# Patient Record
Sex: Female | Born: 1940
Health system: Southern US, Community
[De-identification: ages and names within clinical notes are randomized; demographics above are authoritative.]

## PROBLEM LIST (undated history)

## (undated) ENCOUNTER — Emergency Department: Admission: EM | Payer: PPO

## (undated) DIAGNOSIS — E785 Hyperlipidemia, unspecified: Secondary | ICD-10-CM

## (undated) DIAGNOSIS — I2510 Atherosclerotic heart disease of native coronary artery without angina pectoris: Secondary | ICD-10-CM

## (undated) DIAGNOSIS — E079 Disorder of thyroid, unspecified: Secondary | ICD-10-CM

## (undated) DIAGNOSIS — I5181 Takotsubo syndrome: Secondary | ICD-10-CM

## (undated) DIAGNOSIS — I5020 Unspecified systolic (congestive) heart failure: Secondary | ICD-10-CM

## (undated) HISTORY — DX: Hyperlipidemia, unspecified: E78.5

## (undated) HISTORY — DX: Unspecified systolic (congestive) heart failure: I50.20

## (undated) HISTORY — DX: Atherosclerotic heart disease of native coronary artery without angina pectoris: I25.10

## (undated) HISTORY — DX: Takotsubo syndrome: I51.81

## (undated) HISTORY — PX: INGUINAL HERNIA REPAIR: SUR1180

## (undated) HISTORY — DX: Disorder of thyroid, unspecified: E07.9

---

## 2007-06-06 ENCOUNTER — Ambulatory Visit: Payer: Self-pay | Admitting: Family Medicine

## 2011-08-29 HISTORY — PX: COLONOSCOPY: SHX174

## 2012-02-19 DIAGNOSIS — E039 Hypothyroidism, unspecified: Secondary | ICD-10-CM | POA: Insufficient documentation

## 2014-02-10 DIAGNOSIS — R42 Dizziness and giddiness: Secondary | ICD-10-CM | POA: Insufficient documentation

## 2015-03-04 DIAGNOSIS — E785 Hyperlipidemia, unspecified: Secondary | ICD-10-CM | POA: Insufficient documentation

## 2015-12-13 ENCOUNTER — Ambulatory Visit: Payer: Self-pay | Admitting: Family Medicine

## 2016-03-01 ENCOUNTER — Ambulatory Visit: Payer: PPO | Admitting: Family Medicine

## 2016-03-09 ENCOUNTER — Encounter: Payer: Self-pay | Admitting: Family Medicine

## 2016-03-09 ENCOUNTER — Ambulatory Visit (INDEPENDENT_AMBULATORY_CARE_PROVIDER_SITE_OTHER): Payer: PPO | Admitting: Family Medicine

## 2016-03-09 ENCOUNTER — Ambulatory Visit: Payer: PPO | Admitting: Family Medicine

## 2016-03-09 VITALS — BP 120/80 | HR 64 | Resp 12 | Ht 62.0 in | Wt 113.0 lb

## 2016-03-09 DIAGNOSIS — Z7189 Other specified counseling: Secondary | ICD-10-CM

## 2016-03-09 DIAGNOSIS — E032 Hypothyroidism due to medicaments and other exogenous substances: Secondary | ICD-10-CM | POA: Diagnosis not present

## 2016-03-09 DIAGNOSIS — E785 Hyperlipidemia, unspecified: Secondary | ICD-10-CM

## 2016-03-09 DIAGNOSIS — Z7689 Persons encountering health services in other specified circumstances: Secondary | ICD-10-CM

## 2016-03-09 MED ORDER — SYNTHROID 50 MCG PO TABS: 50.0000 ug | ORAL_TABLET | Freq: Every day | ORAL | Status: DC

## 2016-03-09 MED ORDER — SYNTHROID 25 MCG PO TABS: 12.5000 ug | ORAL_TABLET | Freq: Every day | ORAL | Status: DC

## 2016-03-09 NOTE — Patient Instructions (Signed)
GUIDELINES FOR  LOW-CHOLESTEROL, LOW-TRIGLYCERIDE DIETS    FOODS TO USE   MEATS, FISH Choose lean meats (chicken, turkey, veal, and non-fatty cuts of beef with excess fat trimmed; one serving = 3 oz of cooked meat). Also, fresh or frozen fish, canned fish packed in water, and shellfish (lobster, crabs, shrimp, and oysters). Limit use to no more than one serving of one of these per week. Shellfish are high in cholesterol but low in saturated fat and should be used sparingly. Meats and fish should be broiled (pan or oven) or baked on a rack.  EGGS Egg substitutes and egg whites (use freely). Egg yolks (limit two per week).  FRUITS Eat three servings of fresh fruit per day (1 serving =  cup). Be sure to have at least one citrus fruit daily. Frozen and canned fruit with no sugar or syrup added may be used.  VEGETABLES Most vegetables are not limited (see next page). One dark-green (string beans, escarole) or one deep yellow (squash) vegetable is recommended daily. Cauliflower, broccoli, and celery, as well as potato skins, are recommended for their fiber content. (Fiber is associated with cholesterol reduction) It is preferable to steam vegetables, but they may be boiled, strained, or braised with polyunsaturated vegetable oil (see below).  BEANS Dried peas or beans (1 serving =  cup) may be used as a bread substitute.  NUTS Almonds, walnuts, and peanuts may be used sparingly  (1 serving = 1 Tablespoonful). Use pumpkin, sesame, or sunflower seeds.  BREADS, GRAINS One roll or one slice of whole grain or enriched bread may be used, or three soda crackers or four pieces of melba toast as a substitute. Spaghetti, rice or noodles ( cup) or  large ear of corn may be used as a bread substitute. In preparing these foods do not use butter or shortening, use soft margarine. Also use egg and sugar substitutes.  Choose high fiber grains, such as oats and whole wheat.  CEREALS Use  cup of hot cereal or  cup of  cold cereal per day. Add a sugar substitute if desired, with 99% fat free or skim milk.  MILK PRODUCTS Always use 99% fat free or skim milk, dairy products such as low fat cheeses (farmer's uncreamed diet cottage), low-fat yogurt, and powdered skim milk.  FATS, OILS Use soft (not stick) margarine; vegetable oils that are high in polyunsaturated fats (such as safflower, sunflower, soybean, corn, and cottonseed). Always refrigerate meat drippings to harden the fat and remove it before preparing gravies  DESSERTS, SNACKS Limit to two servings per day; substitute each serving for a bread/cereal serving: ice milk, water sherbet (1/4 cup); unflavored gelatin or gelatin flavored with sugar substitute (1/3 cup); pudding prepared with skim milk (1/2 cup); egg white souffls; unbuttered popcorn (1  cups). Substitute carob for chocolate.  BEVERAGES Fresh fruit juices (limit 4 oz per day); black coffee, plain or herbal teas; soft drinks with sugar substitutes; club soda, preferably salt-free; cocoa made with skim milk or nonfat dried milk and water (sugar substitute added if desired); clear broth. Alcohol: limit two servings per day (see second page).  MISCELLANEOUS  You may use the following freely: vinegar, spices, herbs, nonfat bouillon, mustard, Worcestershire sauce, soy sauce, flavoring essence.                  GUIDELINES FOR  LOW-CHOLESTEROL, LOW TRIGLYCERIDE DIETS    FOODS TO AVOID   MEATS, FISH Marbled beef, pork, bacon, sausage, and other pork products; fatty   fowl (duck, goose); skin and fat of turkey and chicken; processed meats; luncheon meats (salami, bologna); frankfurters and fast-food hamburgers (theyre loaded with fat); organ meats (kidneys, liver); canned fish packed in oil.  EGGS Limit egg yolks to two per week.   FRUITS Coconuts (rich in saturated fats).  VEGETABLES Avoid avocados. Starchy vegetables (potatoes, corn, lima beans, dried peas, beans) may be used only if  substitutes for a serving of bread or cereal. (Baked potato skin, however, is desirable for its fiber content.  BEANS Commercial baked beans with sugar and/or pork added.  NUTS Avoid nuts.  Limit peanuts and walnuts to one tablespoonful per day.  BREADS, GRAINS Any baked goods with shortening and/or sugar. Commercial mixes with dried eggs and whole milk. Avoid sweet rolls, doughnuts, breakfast pastries (Danish), and sweetened packaged cereals (the added sugar converts readily to triglycerides).  MILK PRODUCTS Whole milk and whole-milk packaged goods; cream; ice cream; whole-milk puddings, yogurt, or cheeses; nondairy cream substitutes.  FATS, OILS Butter, lard, animal fats, bacon drippings, gravies, cream sauces as well as palm and coconut oils. All these are high in saturated fats. Examine labels on cholesterol free products for hydrogenated fats. (These are oils that have been hardened into solids and in the process have become saturated.)  DESSERTS, SNACKS Fried snack foods like potato chips; chocolate; candies in general; jams, jellies, syrups; whole- milk puddings; ice cream and milk sherbets; hydrogenated peanut butter.  BEVERAGES Sugared fruit juices and soft drinks; cocoa made with whole milk and/or sugar. When using alcohol (1 oz liquor, 5 oz beer, or 2  oz dry table wine per serving), one serving must be substituted for one bread or cereal serving (limit, two servings of alcohol per day).   SPECIAL NOTES    1. Remember that even non-limited foods should be used in moderation. 2. While on a cholesterol-lowering diet, be sure to avoid animal fats and marbled meats. 3. 3. While on a triglyceride-lowering diet, be sure to avoid sweets and to control the amount of carbohydrates you eat (starchy foods such as flour, bread, potatoes).While on a tri-glyceride-lowering diet, be sure to avoid sweets 4. Buy a good low-fat cookbook, such as the one published by the American Heart Association. 5. Consult  your physician if you have any questions.               [facility] Low Glycemic Diet Plan   Low Glycemic Foods (20-49) Moderate Glycemic Foods (50-69) High Glycemic Foods (70-100)      Breakfast Creals Breakfast Cereals Breakfast Cereals  All Bran All-Bran Fruit'n Oats   Bran Buds Bran Chex   Cheerios Corn chex    Fiber One Oatmeal (not instant)   Just Right Mini-Wheats   Corn Flakes Cream of Wheat    Oat Bran Special K Swiss Muesli   Grape Nuts Grape Nut Flakes      Grits Nutri-Grain    Fruits and fruit juice: Fruits Puffed Rice Puffed Wheat    (Limit to 1-2 Servings per day) Banana (under-ride) Dates   Rice Chex Rice Krispies    Apples Apricots (fresh/dried)   Figs Grapes   Shredded Wheat Team    Blackberries Blueberries   Kiwi Mango   Total     Cherries Cranberries   Oranges Raisins     Peaches Pears    Fruits  Plums Prunes   Fruit Juices Pineapple Watermelon    Grapefruit Raspberries   Cranberry Juice Orange Juice   Banana (over-ripe)       Strawberries Tangerines      Apple Juice Grapefruit Juice   Beans and Legumes Beverages  Tomato Juice    [place]-type baked beans Sodas, sweet tea, pineapple juice   Canned pinto, kidney, or navy beans   Beans and Legumes (fresh-cooked) Green peas Vegetables  Black-eyed peas Butter Beans    Potato, baked, boiled, fried, mashed  Chick peas Lentils   Vegetables [place] fries  Green beans Lima beans   Beets Carrots   Canned or frozen corn  Kidney beans Navy beans   Sweet potato Yam   Parsnips  Pinto beans Snow peas   Corn on the cob Winter squash      Non-starchy vegetables Grains Breads  Asparagus, avocado, broccoli, cabbage Cornmeal Rice, brown   Most breads (white and whole grain)  cauliflower, celery, cucumber, greens Rice, white Couscous   Bagels Bread sticks    lettuce, mushrooms, peppers, tomatoes  Bread stuffing Kaiser roll    okra, onions, spinach, summer squash Pasta Dinner rolls    [employer], cheese     Grains Ravioli, meat filled Spaghetti, white   Grains  Barley Bulgur    Rice, instant Tapioca, with milk    Rye Wild rice   Nuts    Cashews Macadamia   Candy and most cookies  Nuts and oils    Almonds, peanuts, sunflower seeds Snacks Snacks  hazelnuts, pecans, walnuts Chocolate Ice cream, lowfat   Donuts Corn chips    Oils that are liquid at room temperature Muffin Popcorn   Jelly beans Pretzels      Pastries  Dairy, fish, meat, soy, and eggs    Milk, skim Lowfat cheese    Restaurant and ethnic foods  Yogurt, lowfat, fruit sugar sweetened  Most [place] food (sugar in stir fry    or wok sauce)  Lean red meat Fish    Teriyaki-style meats and vegetables  Skinless chicken and [place], shellfish        Egg whites (up to 3 daily), Soy Products    Egg yolks (up to 7 or _____ per week)       Why follow it? Research shows. . Those who follow the Mediterranean diet have a reduced risk of heart disease  . The diet is associated with a reduced incidence of Parkinson's and Alzheimer's diseases . People following the diet may have longer life expectancies and lower rates of chronic diseases  . The Dietary Guidelines for Americans recommends the Mediterranean diet as an eating plan to promote health and prevent disease  What Is the Mediterranean Diet?  . Healthy eating plan based on typical foods and recipes of Mediterranean-style cooking . The diet is primarily a plant based diet; these foods should make up a majority of meals   Starches - Plant based foods should make up a majority of meals - They are an important sources of vitamins, minerals, energy, antioxidants, and fiber - Choose whole grains, foods high in fiber and minimally processed items  - Typical grain sources include wheat, oats, barley, corn, brown rice, bulgar, farro, millet, polenta, couscous  - Various types of beans include chickpeas, lentils, fava beans, black beans, white beans    Fruits  Veggies - Large quantities of antioxidant rich fruits & veggies; 6 or more servings  - Vegetables can be eaten raw or lightly drizzled with oil and cooked  - Vegetables common to the traditional Mediterranean Diet include: artichokes, arugula, beets, broccoli, brussel sprouts, cabbage, carrots, celery, collard greens, cucumbers, eggplant, kale,  leeks, lemons, lettuce, mushrooms, okra, onions, peas, peppers, potatoes, pumpkin, radishes, rutabaga, shallots, spinach, sweet potatoes, turnips, zucchini - Fruits common to the Mediterranean Diet include: apples, apricots, avocados, cherries, clementines, dates, figs, grapefruits, grapes, melons, nectarines, oranges, peaches, pears, pomegranates, strawberries, tangerines  Fats - Replace butter and margarine with healthy oils, such as olive oil, canola oil, and tahini  - Limit nuts to no more than a handful a day  - Nuts include walnuts, almonds, pecans, pistachios, pine nuts  - Limit or avoid candied, honey roasted or heavily salted nuts - Olives are central to the [employer] - can be eaten whole or used in a variety of dishes   Meats Protein - Limiting red meat: no more than a few times a month - When eating red meat: choose lean cuts and keep the portion to the size of deck of cards - Eggs: approx. 0 to 4 times a week  - Fish and lean poultry: at least 2 a week  - Healthy protein sources include, chicken, [place], lean beef, lamb - Increase intake of seafood such as tuna, salmon, trout, mackerel, shrimp, scallops - Avoid or limit high fat processed meats such as sausage and bacon  Dairy - Include moderate amounts of low fat dairy products  - Focus on healthy dairy such as fat free yogurt, skim milk, low or reduced fat cheese - Limit dairy products higher in fat such as whole or 2% milk, cheese, ice cream  Alcohol - Moderate amounts of red wine is ok  - No more than 5 oz daily for women (all ages) and men older than age 54  - No more  than 10 oz of wine daily for men younger than 7  Other - Limit sweets and other desserts  - Use herbs and spices instead of salt to flavor foods  - Herbs and spices common to the traditional Mediterranean Diet include: basil, bay leaves, chives, cloves, cumin, fennel, garlic, lavender, marjoram, mint, oregano, parsley, pepper, rosemary, sage, savory, sumac, tarragon, thyme   It's not just a diet, it's a lifestyle:  . The Mediterranean diet includes lifestyle factors typical of those in the region  . Foods, drinks and meals are best eaten with others and savored . Daily physical activity is important for overall good health . This could be strenuous exercise like running and aerobics . This could also be more leisurely activities such as walking, housework, yard-work, or taking the stairs . Moderation is the key; a balanced and healthy diet accommodates most foods and drinks . Consider portion sizes and frequency of consumption of certain foods   Meal Ideas & Options:  . Breakfast:  o Whole wheat toast or whole wheat English muffins with peanut butter & hard boiled egg o Steel cut oats topped with apples & cinnamon and skim milk  o Fresh fruit: banana, strawberries, melon, berries, peaches  o Smoothies: strawberries, bananas, greek yogurt, peanut butter o Low fat greek yogurt with blueberries and granola  o Egg white omelet with spinach and mushrooms o Breakfast couscous: whole wheat couscous, apricots, skim milk, cranberries  . Sandwiches:  o Hummus and grilled vegetables (peppers, zucchini, squash) on whole wheat bread   o Grilled chicken on whole wheat pita with lettuce, tomatoes, cucumbers or tzatziki  o Tuna salad on whole wheat bread: tuna salad made with greek yogurt, olives, red peppers, capers, green onions o Garlic rosemary lamb pita: lamb sauted with garlic, rosemary, salt & pepper; add lettuce, cucumber, greek  yogurt to pita - flavor with lemon juice and black pepper   . Seafood:  o Mediterranean grilled salmon, seasoned with garlic, basil, parsley, lemon juice and black pepper o Shrimp, lemon, and spinach whole-grain pasta salad made with low fat greek yogurt  o Seared scallops with lemon orzo  o Seared tuna steaks seasoned salt, pepper, coriander topped with tomato mixture of olives, tomatoes, olive oil, minced garlic, parsley, green onions and cappers  . Meats:  o Herbed greek chicken salad with kalamata olives, cucumber, feta  o Red bell peppers stuffed with spinach, bulgur, lean ground beef (or lentils) & topped with feta   o Kebabs: skewers of chicken, tomatoes, onions, zucchini, squash  o [place] burgers: made with red onions, mint, dill, lemon juice, feta cheese topped with roasted red peppers . Vegetarian o Cucumber salad: cucumbers, artichoke hearts, celery, red onion, feta cheese, tossed in olive oil & lemon juice  o Hummus and whole grain pita points with a greek salad (lettuce, tomato, feta, olives, cucumbers, red onion) o Lentil soup with celery, carrots made with vegetable broth, garlic, salt and pepper  o Tabouli salad: parsley, bulgur, mint, scallions, cucumbers, tomato, radishes, lemon juice, olive oil, salt and pepper.        Why follow it? Research shows. . Those who follow the Mediterranean diet have a reduced risk of heart disease  . The diet is associated with a reduced incidence of Parkinson's and Alzheimer's diseases . People following the diet may have longer life expectancies and lower rates of chronic diseases  . The Dietary Guidelines for Americans recommends the Mediterranean diet as an eating plan to promote health and prevent disease  What Is the Mediterranean Diet?  . Healthy eating plan based on typical foods and recipes of Mediterranean-style cooking . The diet is primarily a plant based diet; these foods should make up a majority of meals   Starches - Plant based foods should make up a majority of meals - They are an  important sources of vitamins, minerals, energy, antioxidants, and fiber - Choose whole grains, foods high in fiber and minimally processed items  - Typical grain sources include wheat, oats, barley, corn, brown rice, bulgar, farro, millet, polenta, couscous  - Various types of beans include chickpeas, lentils, fava beans, black beans, white beans   Fruits  Veggies - Large quantities of antioxidant rich fruits & veggies; 6 or more servings  - Vegetables can be eaten raw or lightly drizzled with oil and cooked  - Vegetables common to the traditional Mediterranean Diet include: artichokes, arugula, beets, broccoli, brussel sprouts, cabbage, carrots, celery, collard greens, cucumbers, eggplant, kale, leeks, lemons, lettuce, mushrooms, okra, onions, peas, peppers, potatoes, pumpkin, radishes, rutabaga, shallots, spinach, sweet potatoes, turnips, zucchini - Fruits common to the Mediterranean Diet include: apples, apricots, avocados, cherries, clementines, dates, figs, grapefruits, grapes, melons, nectarines, oranges, peaches, pears, pomegranates, strawberries, tangerines  Fats - Replace butter and margarine with healthy oils, such as olive oil, canola oil, and tahini  - Limit nuts to no more than a handful a day  - Nuts include walnuts, almonds, pecans, pistachios, pine nuts  - Limit or avoid candied, honey roasted or heavily salted nuts - Olives are central to the [employer] - can be eaten whole or used in a variety of dishes   Meats Protein - Limiting red meat: no more than a few times a month - When eating red meat: choose lean cuts and keep the portion to the size of  deck of cards - Eggs: approx. 0 to 4 times a week  - Fish and lean poultry: at least 2 a week  - Healthy protein sources include, chicken, [place], lean beef, lamb - Increase intake of seafood such as tuna, salmon, trout, mackerel, shrimp, scallops - Avoid or limit high fat processed meats such as sausage and bacon  Dairy -  Include moderate amounts of low fat dairy products  - Focus on healthy dairy such as fat free yogurt, skim milk, low or reduced fat cheese - Limit dairy products higher in fat such as whole or 2% milk, cheese, ice cream  Alcohol - Moderate amounts of red wine is ok  - No more than 5 oz daily for women (all ages) and men older than age 19  - No more than 10 oz of wine daily for men younger than 42  Other - Limit sweets and other desserts  - Use herbs and spices instead of salt to flavor foods  - Herbs and spices common to the traditional Mediterranean Diet include: basil, bay leaves, chives, cloves, cumin, fennel, garlic, lavender, marjoram, mint, oregano, parsley, pepper, rosemary, sage, savory, sumac, tarragon, thyme   It's not just a diet, it's a lifestyle:  . The Mediterranean diet includes lifestyle factors typical of those in the region  . Foods, drinks and meals are best eaten with others and savored . Daily physical activity is important for overall good health . This could be strenuous exercise like running and aerobics . This could also be more leisurely activities such as walking, housework, yard-work, or taking the stairs . Moderation is the key; a balanced and healthy diet accommodates most foods and drinks . Consider portion sizes and frequency of consumption of certain foods   Meal Ideas & Options:  . Breakfast:  o Whole wheat toast or whole wheat English muffins with peanut butter & hard boiled egg o Steel cut oats topped with apples & cinnamon and skim milk  o Fresh fruit: banana, strawberries, melon, berries, peaches  o Smoothies: strawberries, bananas, greek yogurt, peanut butter o Low fat greek yogurt with blueberries and granola  o Egg white omelet with spinach and mushrooms o Breakfast couscous: whole wheat couscous, apricots, skim milk, cranberries  . Sandwiches:  o Hummus and grilled vegetables (peppers, zucchini, squash) on whole wheat bread   o Grilled chicken  on whole wheat pita with lettuce, tomatoes, cucumbers or tzatziki  o Tuna salad on whole wheat bread: tuna salad made with greek yogurt, olives, red peppers, capers, green onions o Garlic rosemary lamb pita: lamb sauted with garlic, rosemary, salt & pepper; add lettuce, cucumber, greek yogurt to pita - flavor with lemon juice and black pepper  . Seafood:  o Mediterranean grilled salmon, seasoned with garlic, basil, parsley, lemon juice and black pepper o Shrimp, lemon, and spinach whole-grain pasta salad made with low fat greek yogurt  o Seared scallops with lemon orzo  o Seared tuna steaks seasoned salt, pepper, coriander topped with tomato mixture of olives, tomatoes, olive oil, minced garlic, parsley, green onions and cappers  . Meats:  o Herbed greek chicken salad with kalamata olives, cucumber, feta  o Red bell peppers stuffed with spinach, bulgur, lean ground beef (or lentils) & topped with feta   o Kebabs: skewers of chicken, tomatoes, onions, zucchini, squash  o [place] burgers: made with red onions, mint, dill, lemon juice, feta cheese topped with roasted red peppers . Vegetarian o Cucumber salad: cucumbers, artichoke hearts, celery,  red onion, feta cheese, tossed in olive oil & lemon juice  o Hummus and whole grain pita points with a greek salad (lettuce, tomato, feta, olives, cucumbers, red onion) o Lentil soup with celery, carrots made with vegetable broth, garlic, salt and pepper  o Tabouli salad: parsley, bulgur, mint, scallions, cucumbers, tomato, radishes, lemon juice, olive oil, salt and pepper.

## 2016-03-09 NOTE — Progress Notes (Signed)
Name: Lori Rosario   MRN: [id number]    DOB: Nov 10, 1940   Date:03/09/2016       Progress Note  Subjective  Chief Complaint  Chief Complaint  Patient presents with  . Establish Care    moving from Dr Tobin/Ins changed  . Hypothyroidism    HPI Comments: Patient presents to establish care.  Thyroid Problem Patient reports no anxiety, cold intolerance, constipation, depressed mood, diaphoresis, diarrhea, dry skin, fatigue, hair loss, heat intolerance, hoarse voice, leg swelling, menstrual problem, nail problem, palpitations, tremors, visual change, weight gain or weight loss. The symptoms have been stable. Past treatments include levothyroxine. The treatment provided mild relief. Prior procedures include radioiodine uptake scan. There is no history of atrial fibrillation, dementia, diabetes, heart failure, hyperlipidemia, neuropathy, obesity or osteopenia. Risk factors include family history of hyperthyroidism and family history of hypothyroidism.    No problem-specific assessment & plan notes found for this encounter.   Past Medical History  Diagnosis Date  . Thyroid disease     Past Surgical History  Procedure Laterality Date  . Inguinal hernia repair    . Colonoscopy  2013    cleared for 10 yrs- Dr @ [facility]    Family History  Problem Relation Age of Onset  . Heart disease Father     Social History   Social History  . Marital Status: Married    Spouse Name: N/A  . Number of Children: N/A  . Years of Education: N/A   Occupational History  . Not on file.   Social History Main Topics  . Smoking status: Never Smoker   . Smokeless tobacco: Not on file  . Alcohol Use: No  . Drug Use: No  . Sexual Activity: Not Currently   Other Topics Concern  . Not on file   Social History Narrative  . No narrative on file    No Known Allergies   Review of Systems  Constitutional: Negative for fever, chills, weight loss, weight gain, malaise/fatigue, diaphoresis and fatigue.   HENT: Negative for ear discharge, ear pain, hoarse voice and sore throat.   Eyes: Negative for blurred vision.  Respiratory: Negative for cough, sputum production, shortness of breath and wheezing.   Cardiovascular: Negative for chest pain, palpitations and leg swelling.  Gastrointestinal: Negative for heartburn, nausea, abdominal pain, diarrhea, constipation, blood in stool and melena.  Genitourinary: Negative for dysuria, urgency, frequency, hematuria and menstrual problem.  Musculoskeletal: Negative for myalgias, back pain, joint pain and neck pain.  Skin: Negative for rash.  Neurological: Negative for dizziness, tingling, tremors, sensory change, focal weakness and headaches.  Endo/Heme/Allergies: Negative for environmental allergies, cold intolerance, heat intolerance and polydipsia. Does not bruise/bleed easily.  Psychiatric/Behavioral: Negative for depression and suicidal ideas. The patient is not nervous/anxious and does not have insomnia.      Objective  Filed Vitals:   03/09/16 1431  BP: 120/80  Pulse: 64  Resp: 12  Height: 5\' 2"  (1.575 m)  Weight: 113 lb (51.256 kg)    Physical Exam  Constitutional: She is well-developed, well-nourished, and in no distress. No distress.  HENT:  Head: Normocephalic and atraumatic.  Right Ear: External ear normal.  Left Ear: External ear normal.  Nose: Nose normal.  Mouth/Throat: Oropharynx is clear and moist.  Eyes: Conjunctivae and EOM are normal. Pupils are equal, round, and reactive to light. Right eye exhibits no discharge. Left eye exhibits no discharge.  Neck: Normal range of motion. Neck supple. No JVD present. No thyromegaly present.  Cardiovascular: Normal rate, regular rhythm, normal heart sounds and intact distal pulses.  Exam reveals no gallop and no friction rub.   No murmur heard. Pulmonary/Chest: Effort normal and breath sounds normal. She has no wheezes. She has no rales.  Abdominal: Soft. Bowel sounds are normal. She  exhibits no mass. There is no tenderness. There is no guarding.  Musculoskeletal: Normal range of motion. She exhibits no edema.  Lymphadenopathy:    She has no cervical adenopathy.  Neurological: She is alert.  Skin: Skin is warm and dry. She is not diaphoretic.  Psychiatric: Mood and affect normal.  Nursing note and vitals reviewed.     Assessment & Plan  Problem List Items Addressed This Visit      Endocrine   Adult hypothyroidism   Relevant Medications   SYNTHROID 25 MCG tablet   SYNTHROID 50 MCG tablet     Other   Dyslipidemia    Other Visit Diagnoses    Encounter to establish care with new doctor    -  Primary    rtc for labs/ tsh and lipid         Dr. Deanna Jones [facility]  03/09/2016

## 2016-03-10 ENCOUNTER — Other Ambulatory Visit: Payer: PPO

## 2016-03-10 DIAGNOSIS — Z13220 Encounter for screening for lipoid disorders: Secondary | ICD-10-CM

## 2016-03-10 DIAGNOSIS — E039 Hypothyroidism, unspecified: Secondary | ICD-10-CM

## 2016-03-11 LAB — LIPID PANEL
Chol/HDL Ratio: 3.5 ratio units (ref 0.0–4.4)
Cholesterol, Total: 227 mg/dL — ABNORMAL HIGH (ref 100–199)
HDL: 65 mg/dL (ref >39)
LDL CALC: 144 mg/dL — AB (ref 0–99)
Triglycerides: 89 mg/dL (ref 0–149)
VLDL CHOLESTEROL CAL: 18 mg/dL (ref 5–40)

## 2016-03-11 LAB — TSH: TSH: 5.35 u[IU]/mL — ABNORMAL HIGH (ref 0.450–4.500)

## 2016-03-17 ENCOUNTER — Other Ambulatory Visit: Payer: Self-pay

## 2016-03-17 DIAGNOSIS — E032 Hypothyroidism due to medicaments and other exogenous substances: Secondary | ICD-10-CM

## 2016-03-17 MED ORDER — SYNTHROID 50 MCG PO TABS: 50.0000 ug | ORAL_TABLET | Freq: Every day | ORAL | Status: DC

## 2016-03-17 MED ORDER — LEVOTHYROXINE SODIUM 75 MCG PO TABS: 75.0000 ug | ORAL_TABLET | Freq: Every day | ORAL | Status: DC

## 2016-03-17 MED ORDER — SYNTHROID 25 MCG PO TABS: 12.5000 ug | ORAL_TABLET | Freq: Every day | ORAL | Status: DC

## 2017-02-27 ENCOUNTER — Other Ambulatory Visit: Payer: PPO

## 2017-02-27 ENCOUNTER — Other Ambulatory Visit: Payer: Self-pay

## 2017-03-06 ENCOUNTER — Ambulatory Visit: Payer: PPO | Admitting: Family Medicine

## 2017-04-09 DIAGNOSIS — E7800 Pure hypercholesterolemia, unspecified: Secondary | ICD-10-CM | POA: Diagnosis not present

## 2017-04-09 DIAGNOSIS — E890 Postprocedural hypothyroidism: Secondary | ICD-10-CM | POA: Diagnosis not present

## 2017-04-10 DIAGNOSIS — E890 Postprocedural hypothyroidism: Secondary | ICD-10-CM | POA: Diagnosis not present

## 2017-04-10 DIAGNOSIS — E7800 Pure hypercholesterolemia, unspecified: Secondary | ICD-10-CM | POA: Diagnosis not present

## 2017-05-30 DIAGNOSIS — Z23 Encounter for immunization: Secondary | ICD-10-CM | POA: Diagnosis not present

## 2018-04-03 DIAGNOSIS — E890 Postprocedural hypothyroidism: Secondary | ICD-10-CM | POA: Diagnosis not present

## 2018-04-09 DIAGNOSIS — E7800 Pure hypercholesterolemia, unspecified: Secondary | ICD-10-CM | POA: Diagnosis not present

## 2018-04-09 DIAGNOSIS — E890 Postprocedural hypothyroidism: Secondary | ICD-10-CM | POA: Diagnosis not present

## 2018-04-10 DIAGNOSIS — E7800 Pure hypercholesterolemia, unspecified: Secondary | ICD-10-CM | POA: Diagnosis not present

## 2018-04-12 DIAGNOSIS — L570 Actinic keratosis: Secondary | ICD-10-CM | POA: Diagnosis not present

## 2018-04-12 DIAGNOSIS — X32XXXA Exposure to sunlight, initial encounter: Secondary | ICD-10-CM | POA: Diagnosis not present

## 2018-04-12 DIAGNOSIS — L821 Other seborrheic keratosis: Secondary | ICD-10-CM | POA: Diagnosis not present

## 2018-04-12 DIAGNOSIS — D485 Neoplasm of uncertain behavior of skin: Secondary | ICD-10-CM | POA: Diagnosis not present

## 2018-04-12 DIAGNOSIS — D2339 Other benign neoplasm of skin of other parts of face: Secondary | ICD-10-CM | POA: Diagnosis not present

## 2018-07-11 DIAGNOSIS — Z23 Encounter for immunization: Secondary | ICD-10-CM | POA: Diagnosis not present

## 2019-03-31 ENCOUNTER — Other Ambulatory Visit: Payer: Self-pay

## 2019-04-03 DIAGNOSIS — E890 Postprocedural hypothyroidism: Secondary | ICD-10-CM | POA: Diagnosis not present

## 2019-04-03 DIAGNOSIS — E7800 Pure hypercholesterolemia, unspecified: Secondary | ICD-10-CM | POA: Diagnosis not present

## 2019-06-12 DIAGNOSIS — E7800 Pure hypercholesterolemia, unspecified: Secondary | ICD-10-CM | POA: Diagnosis not present

## 2019-06-12 DIAGNOSIS — Z23 Encounter for immunization: Secondary | ICD-10-CM | POA: Diagnosis not present

## 2019-06-12 DIAGNOSIS — Z0000 Encounter for general adult medical examination without abnormal findings: Secondary | ICD-10-CM | POA: Diagnosis not present

## 2019-06-12 DIAGNOSIS — E890 Postprocedural hypothyroidism: Secondary | ICD-10-CM | POA: Diagnosis not present

## 2020-01-16 DIAGNOSIS — D234 Other benign neoplasm of skin of scalp and neck: Secondary | ICD-10-CM | POA: Diagnosis not present

## 2020-01-16 DIAGNOSIS — L820 Inflamed seborrheic keratosis: Secondary | ICD-10-CM | POA: Diagnosis not present

## 2020-01-16 DIAGNOSIS — D2339 Other benign neoplasm of skin of other parts of face: Secondary | ICD-10-CM | POA: Diagnosis not present

## 2020-01-16 DIAGNOSIS — L821 Other seborrheic keratosis: Secondary | ICD-10-CM | POA: Diagnosis not present

## 2020-01-16 DIAGNOSIS — L538 Other specified erythematous conditions: Secondary | ICD-10-CM | POA: Diagnosis not present

## 2020-06-04 DIAGNOSIS — E890 Postprocedural hypothyroidism: Secondary | ICD-10-CM | POA: Diagnosis not present

## 2020-06-04 DIAGNOSIS — E7800 Pure hypercholesterolemia, unspecified: Secondary | ICD-10-CM | POA: Diagnosis not present

## 2020-06-11 DIAGNOSIS — Z23 Encounter for immunization: Secondary | ICD-10-CM | POA: Diagnosis not present

## 2020-06-11 DIAGNOSIS — E7800 Pure hypercholesterolemia, unspecified: Secondary | ICD-10-CM | POA: Diagnosis not present

## 2020-06-11 DIAGNOSIS — E890 Postprocedural hypothyroidism: Secondary | ICD-10-CM | POA: Diagnosis not present

## 2020-06-11 DIAGNOSIS — Z0000 Encounter for general adult medical examination without abnormal findings: Secondary | ICD-10-CM | POA: Diagnosis not present

## 2020-06-15 DIAGNOSIS — Z0000 Encounter for general adult medical examination without abnormal findings: Secondary | ICD-10-CM | POA: Diagnosis not present

## 2020-06-15 DIAGNOSIS — Z23 Encounter for immunization: Secondary | ICD-10-CM | POA: Diagnosis not present

## 2020-06-15 DIAGNOSIS — E890 Postprocedural hypothyroidism: Secondary | ICD-10-CM | POA: Diagnosis not present

## 2020-06-15 DIAGNOSIS — E7800 Pure hypercholesterolemia, unspecified: Secondary | ICD-10-CM | POA: Diagnosis not present

## 2021-03-04 ENCOUNTER — Other Ambulatory Visit: Payer: Self-pay

## 2021-03-04 ENCOUNTER — Encounter: Payer: Self-pay | Admitting: Emergency Medicine

## 2021-03-04 ENCOUNTER — Encounter
Admission: EM | Disposition: A | Discharge status: Home / Self Care | Payer: Self-pay | Source: Home / Self Care | Attending: Emergency Medicine

## 2021-03-04 ENCOUNTER — Observation Stay
Admission: EM | Admit: 2021-03-04 | Discharge: 2021-03-05 | Disposition: A | Discharge status: Home / Self Care | Payer: PPO | Attending: Internal Medicine | Admitting: Internal Medicine

## 2021-03-04 ENCOUNTER — Emergency Department: Payer: PPO

## 2021-03-04 ENCOUNTER — Ambulatory Visit
Admission: EM | Admit: 2021-03-04 | Discharge: 2021-03-04 | Disposition: A | Discharge status: Home / Self Care | Payer: PPO | Source: Home / Self Care

## 2021-03-04 DIAGNOSIS — I213 ST elevation (STEMI) myocardial infarction of unspecified site: Secondary | ICD-10-CM

## 2021-03-04 DIAGNOSIS — R0789 Other chest pain: Secondary | ICD-10-CM | POA: Diagnosis not present

## 2021-03-04 DIAGNOSIS — E039 Hypothyroidism, unspecified: Secondary | ICD-10-CM

## 2021-03-04 DIAGNOSIS — I2119 ST elevation (STEMI) myocardial infarction involving other coronary artery of inferior wall: Secondary | ICD-10-CM

## 2021-03-04 DIAGNOSIS — H9201 Otalgia, right ear: Secondary | ICD-10-CM | POA: Insufficient documentation

## 2021-03-04 DIAGNOSIS — R079 Chest pain, unspecified: Secondary | ICD-10-CM | POA: Insufficient documentation

## 2021-03-04 DIAGNOSIS — Z20822 Contact with and (suspected) exposure to covid-19: Secondary | ICD-10-CM | POA: Diagnosis not present

## 2021-03-04 DIAGNOSIS — I499 Cardiac arrhythmia, unspecified: Secondary | ICD-10-CM | POA: Diagnosis not present

## 2021-03-04 DIAGNOSIS — I214 Non-ST elevation (NSTEMI) myocardial infarction: Secondary | ICD-10-CM | POA: Diagnosis not present

## 2021-03-04 DIAGNOSIS — I2510 Atherosclerotic heart disease of native coronary artery without angina pectoris: Secondary | ICD-10-CM | POA: Diagnosis not present

## 2021-03-04 DIAGNOSIS — Z79899 Other long term (current) drug therapy: Secondary | ICD-10-CM | POA: Diagnosis not present

## 2021-03-04 DIAGNOSIS — E785 Hyperlipidemia, unspecified: Secondary | ICD-10-CM

## 2021-03-04 DIAGNOSIS — I5181 Takotsubo syndrome: Principal | ICD-10-CM | POA: Diagnosis present

## 2021-03-04 DIAGNOSIS — R42 Dizziness and giddiness: Secondary | ICD-10-CM

## 2021-03-04 DIAGNOSIS — I959 Hypotension, unspecified: Secondary | ICD-10-CM | POA: Diagnosis not present

## 2021-03-04 DIAGNOSIS — I5021 Acute systolic (congestive) heart failure: Secondary | ICD-10-CM | POA: Diagnosis not present

## 2021-03-04 HISTORY — PX: LEFT HEART CATH AND CORONARY ANGIOGRAPHY: CATH118249

## 2021-03-04 LAB — GLUCOSE, CAPILLARY: Glucose-Capillary: 130 mg/dL — ABNORMAL HIGH (ref 70–99)

## 2021-03-04 LAB — CBC WITH DIFFERENTIAL/PLATELET
Abs Immature Granulocytes: 0.03 10*3/uL (ref 0.00–0.07)
Basophils Absolute: 0 10*3/uL (ref 0.0–0.1)
Basophils Relative: 0 %
Eosinophils Absolute: 0.1 10*3/uL (ref 0.0–0.5)
Eosinophils Relative: 1 %
HCT: 44 % (ref 36.0–46.0)
Hemoglobin: 14.7 g/dL (ref 12.0–15.0)
Immature Granulocytes: 0 %
Lymphocytes Relative: 22 %
Lymphs Abs: 1.9 10*3/uL (ref 0.7–4.0)
MCH: 30.2 pg (ref 26.0–34.0)
MCHC: 33.4 g/dL (ref 30.0–36.0)
MCV: 90.5 fL (ref 80.0–100.0)
Monocytes Absolute: 0.8 10*3/uL (ref 0.1–1.0)
Monocytes Relative: 9 %
Neutro Abs: 5.6 10*3/uL (ref 1.7–7.7)
Neutrophils Relative %: 68 %
Platelets: 282 10*3/uL (ref 150–400)
RBC: 4.86 MIL/uL (ref 3.87–5.11)
RDW: 13.4 % (ref 11.5–15.5)
WBC: 8.4 10*3/uL (ref 4.0–10.5)
nRBC: 0 % (ref 0.0–0.2)

## 2021-03-04 LAB — LIPID PANEL
Cholesterol: 234 mg/dL — ABNORMAL HIGH (ref 0–200)
HDL: 56 mg/dL (ref >40)
LDL Cholesterol: 147 mg/dL — ABNORMAL HIGH (ref 0–99)
Total CHOL/HDL Ratio: 4.2 RATIO
Triglycerides: 157 mg/dL — ABNORMAL HIGH (ref <150)
VLDL: 31 mg/dL (ref 0–40)

## 2021-03-04 LAB — COMPREHENSIVE METABOLIC PANEL
ALT: 18 U/L (ref 0–44)
AST: 39 U/L (ref 15–41)
Albumin: 3.7 g/dL (ref 3.5–5.0)
Alkaline Phosphatase: 58 U/L (ref 38–126)
Anion gap: 6 (ref 5–15)
BUN: 19 mg/dL (ref 8–23)
CO2: 25 mmol/L (ref 22–32)
Calcium: 9.6 mg/dL (ref 8.9–10.3)
Chloride: 103 mmol/L (ref 98–111)
Creatinine, Ser: 0.73 mg/dL (ref 0.44–1.00)
GFR, Estimated: >60 mL/min (ref >60)
Glucose, Bld: 98 mg/dL (ref 70–99)
Potassium: 4.5 mmol/L (ref 3.5–5.1)
Sodium: 134 mmol/L — ABNORMAL LOW (ref 135–145)
Total Bilirubin: 0.7 mg/dL (ref 0.3–1.2)
Total Protein: 6.8 g/dL (ref 6.5–8.1)

## 2021-03-04 LAB — RESP PANEL BY RT-PCR (FLU A&B, COVID) ARPGX2
Influenza A by PCR: NEGATIVE
Influenza B by PCR: NEGATIVE
SARS Coronavirus 2 by RT PCR: NEGATIVE

## 2021-03-04 LAB — TSH: TSH: 1.963 u[IU]/mL (ref 0.350–4.500)

## 2021-03-04 LAB — TROPONIN I (HIGH SENSITIVITY)
Troponin I (High Sensitivity): 3368 ng/L (ref <18)
Troponin I (High Sensitivity): 4462 ng/L (ref <18)

## 2021-03-04 LAB — MAGNESIUM: Magnesium: 2.7 mg/dL — ABNORMAL HIGH (ref 1.7–2.4)

## 2021-03-04 LAB — MRSA NEXT GEN BY PCR, NASAL: MRSA by PCR Next Gen: NOT DETECTED

## 2021-03-04 SURGERY — LEFT HEART CATH AND CORONARY ANGIOGRAPHY
Anesthesia: Moderate Sedation

## 2021-03-04 MED ORDER — FUROSEMIDE 10 MG/ML IJ SOLN
20.0000 mg | Freq: Every day | INTRAMUSCULAR | Status: DC
  Administered 2021-03-04 – 2021-03-05 (×2): 20 mg via INTRAVENOUS
  Filled 2021-03-04 (×2): qty 2

## 2021-03-04 MED ORDER — HYDRALAZINE HCL 20 MG/ML IJ SOLN: 10.0000 mg | INTRAMUSCULAR | Status: AC | PRN

## 2021-03-04 MED ORDER — MIDAZOLAM HCL 2 MG/2ML IJ SOLN
INTRAMUSCULAR | Status: DC | PRN
Start: 2021-03-04 — End: 2021-03-04
  Administered 2021-03-04: 0.5 mg via INTRAVENOUS

## 2021-03-04 MED ORDER — ASPIRIN EC 81 MG PO TBEC
81.0000 mg | DELAYED_RELEASE_TABLET | Freq: Every day | ORAL | Status: DC
  Administered 2021-03-05: 81 mg via ORAL
  Filled 2021-03-04: qty 1

## 2021-03-04 MED ORDER — HEPARIN (PORCINE) IN NACL 2000-0.9 UNIT/L-% IV SOLN
INTRAVENOUS | Status: DC | PRN
  Administered 2021-03-04: 1000 mL

## 2021-03-04 MED ORDER — LIDOCAINE HCL (PF) 1 % IJ SOLN
INTRAMUSCULAR | Status: AC
  Filled 2021-03-04: qty 30

## 2021-03-04 MED ORDER — LIDOCAINE HCL (PF) 1 % IJ SOLN
INTRAMUSCULAR | Status: DC | PRN
  Administered 2021-03-04: 3 mL

## 2021-03-04 MED ORDER — LEVOTHYROXINE SODIUM 50 MCG PO TABS
75.0000 ug | ORAL_TABLET | Freq: Every day | ORAL | Status: DC
  Administered 2021-03-05: 75 ug via ORAL
  Filled 2021-03-04: qty 2

## 2021-03-04 MED ORDER — ONDANSETRON HCL 4 MG/2ML IJ SOLN: 4.0000 mg | Freq: Four times a day (QID) | INTRAMUSCULAR | Status: DC | PRN

## 2021-03-04 MED ORDER — ACETAMINOPHEN 325 MG PO TABS: 650.0000 mg | ORAL_TABLET | ORAL | Status: DC | PRN

## 2021-03-04 MED ORDER — ASPIRIN 81 MG PO CHEW
324.0000 mg | CHEWABLE_TABLET | Freq: Once | ORAL | Status: AC
  Administered 2021-03-04: 324 mg via ORAL

## 2021-03-04 MED ORDER — MIDAZOLAM HCL 2 MG/2ML IJ SOLN
INTRAMUSCULAR | Status: AC
  Filled 2021-03-04: qty 2

## 2021-03-04 MED ORDER — SODIUM CHLORIDE 0.9% FLUSH
3.0000 mL | Freq: Two times a day (BID) | INTRAVENOUS | Status: DC
  Administered 2021-03-04 – 2021-03-05 (×2): 3 mL via INTRAVENOUS

## 2021-03-04 MED ORDER — FENTANYL CITRATE (PF) 100 MCG/2ML IJ SOLN
INTRAMUSCULAR | Status: DC | PRN
  Administered 2021-03-04: 12.5 ug via INTRAVENOUS

## 2021-03-04 MED ORDER — IOHEXOL 300 MG/ML  SOLN
INTRAMUSCULAR | Status: DC | PRN
  Administered 2021-03-04: 44 mL

## 2021-03-04 MED ORDER — FENTANYL CITRATE (PF) 100 MCG/2ML IJ SOLN
INTRAMUSCULAR | Status: AC
  Filled 2021-03-04: qty 2

## 2021-03-04 MED ORDER — ENOXAPARIN SODIUM 40 MG/0.4ML IJ SOSY
40.0000 mg | PREFILLED_SYRINGE | INTRAMUSCULAR | Status: DC
  Administered 2021-03-05: 40 mg via SUBCUTANEOUS
  Filled 2021-03-04: qty 0.4

## 2021-03-04 MED ORDER — HEPARIN SODIUM (PORCINE) 1000 UNIT/ML IJ SOLN
INTRAMUSCULAR | Status: DC | PRN
  Administered 2021-03-04: 3000 [IU] via INTRAVENOUS

## 2021-03-04 MED ORDER — SODIUM CHLORIDE 0.9% FLUSH
3.0000 mL | INTRAVENOUS | Status: DC | PRN
Start: 2021-03-04 — End: 2021-03-05

## 2021-03-04 MED ORDER — ATORVASTATIN CALCIUM 20 MG PO TABS
40.0000 mg | ORAL_TABLET | Freq: Every day | ORAL | Status: DC
  Administered 2021-03-04 – 2021-03-05 (×2): 40 mg via ORAL
  Filled 2021-03-04 (×2): qty 2

## 2021-03-04 MED ORDER — LABETALOL HCL 5 MG/ML IV SOLN: 10.0000 mg | INTRAVENOUS | Status: AC | PRN

## 2021-03-04 MED ORDER — METOPROLOL SUCCINATE ER 25 MG PO TB24
12.5000 mg | ORAL_TABLET | Freq: Every day | ORAL | Status: DC
  Administered 2021-03-04 – 2021-03-05 (×2): 12.5 mg via ORAL
  Filled 2021-03-04 (×2): qty 0.5

## 2021-03-04 MED ORDER — HEPARIN SODIUM (PORCINE) 1000 UNIT/ML IJ SOLN
INTRAMUSCULAR | Status: AC
  Filled 2021-03-04: qty 1

## 2021-03-04 MED ORDER — VERAPAMIL HCL 2.5 MG/ML IV SOLN
INTRAVENOUS | Status: AC
  Filled 2021-03-04: qty 2

## 2021-03-04 MED ORDER — SODIUM CHLORIDE 0.9 % IV SOLN: 250.0000 mL | INTRAVENOUS | Status: DC | PRN

## 2021-03-04 MED ORDER — VERAPAMIL HCL 2.5 MG/ML IV SOLN
INTRAVENOUS | Status: DC | PRN
  Administered 2021-03-04: 2.5 mg via INTRAVENOUS

## 2021-03-04 MED ORDER — HEPARIN (PORCINE) IN NACL 1000-0.9 UT/500ML-% IV SOLN
INTRAVENOUS | Status: AC
  Filled 2021-03-04: qty 1000

## 2021-03-04 SURGICAL SUPPLY — 11 items
CATH 5F 110X4 TIG (CATHETERS) ×2 IMPLANT
CATH INFINITI 5FR ANG PIGTAIL (CATHETERS) ×2 IMPLANT
DEVICE RAD TR BAND REGULAR (VASCULAR PRODUCTS) ×2 IMPLANT
DRAPE BRACHIAL (DRAPES) ×2 IMPLANT
GLIDESHEATH SLEND SS 6F .021 (SHEATH) ×2 IMPLANT
KIT ENCORE 26 ADVANTAGE (KITS) IMPLANT
PACK CARDIAC CATH (CUSTOM PROCEDURE TRAY) ×2 IMPLANT
PROTECTION STATION PRESSURIZED (MISCELLANEOUS) ×2
SET ATX SIMPLICITY (MISCELLANEOUS) ×2 IMPLANT
STATION PROTECTION PRESSURIZED (MISCELLANEOUS) ×1 IMPLANT
WIRE HI TORQ VERSACORE J 260CM (WIRE) ×2 IMPLANT

## 2021-03-04 NOTE — ED Triage Notes (Addendum)
Patient c/o right ear pain and dizziness that started 2 days ago.  Patient states that her husband passed away a week ago and that she has been having heart palpitations off and on.

## 2021-03-04 NOTE — ED Provider Notes (Signed)
[facility]    CSN: [id number] Arrival date & time: 03/04/21  1410      History   Chief Complaint Chief Complaint  Patient presents with   Otalgia   Dizziness    HPI Lori Rosario is a 80 y.o. female.   HPI  Otalgia: Pt reports that for the past 2 days she has had right ear pain and some dizziness off and on. She reports that today this morning around 7:30 am she awoke with some chest palpitations and chest pains. These lasted about 30 minutes to an hour and improved on their own. She states that she has never had palpitations or chest pains previously. She thought that her symptoms were related to her being stressed and sad over her husbands passing earlier this week so she did not do anything about it. She reports that her ear pain and dizziness restarted around 1:30 PM today. No chest pains, SOB, jaw pain, nausea, sweating at this time. Currently pain is sharp to ache like 6/10 worse in the right side which is the side her chest pain was on. She reports no changes in her medication compliance/dosage. Her last set of labs show normal thyroid function and normal BMP values.   Past Medical History:  Diagnosis Date   Thyroid disease     Patient Active Problem List   Diagnosis Date Noted   Dyslipidemia 03/04/2015   Dizziness 02/10/2014   Adult hypothyroidism 02/19/2012    Past Surgical History:  Procedure Laterality Date   COLONOSCOPY  2013   cleared for 10 yrs- Dr @ [place]      OB History   No obstetric history on file.      Home Medications    Prior to Admission medications   Medication Sig Start Date End Date Taking? Authorizing Provider  calcium citrate-vitamin D (CITRACAL+D) 315-200 MG-UNIT tablet Take 1 tablet by mouth daily.   Yes [provider]  levothyroxine (SYNTHROID, LEVOTHROID) 75 MCG tablet Take 1 tablet (75 mcg total) by mouth daily. 03/17/16  Yes Juline Patch, MD  Multiple Vitamins-Minerals (CENTRUM  SILVER 50+WOMEN PO) Take 1 tablet by mouth daily.   Yes [provider]    Family History Family History  Problem Relation Age of Onset   Heart disease Father     Social History Social History   Tobacco Use   Smoking status: Never   Smokeless tobacco: Never  Vaping Use   Vaping Use: Never used  Substance Use Topics   Alcohol use: No    Alcohol/week: 0.0 standard drinks   Drug use: No     Allergies   Patient has no known allergies.   Review of Systems Review of Systems  As stated above in HPI Physical Exam Triage Vital Signs ED Triage Vitals  Enc Vitals Group     BP 03/04/21 1451 115/70     Pulse Rate 03/04/21 1451 (!) 105     Resp 03/04/21 1451 14     Temp 03/04/21 1451 98.7 F (37.1 C)     Temp Source 03/04/21 1451 Oral     SpO2 03/04/21 1451 97 %     Weight 03/04/21 1449 119 lb (54 kg)     Height 03/04/21 1449 5\' 2"  (1.575 m)     Head Circumference --      Peak Flow --      Pain Score 03/04/21 1449 5     Pain Loc --  Pain Edu? --      Excl. in [place]? --    No data found.  Updated Vital Signs BP 115/70 (BP Location: Left Arm)   Pulse (!) 105   Temp 98.7 F (37.1 C) (Oral)   Resp 14   Ht 5\' 2"  (1.575 m)   Wt 119 lb (54 kg)   SpO2 97%   BMI 21.77 kg/m   Physical Exam Vitals and nursing note reviewed.  Constitutional:      General: She is not in acute distress.    Appearance: Normal appearance. She is not ill-appearing, toxic-appearing or diaphoretic.  HENT:     Head: Normocephalic and atraumatic.     Right Ear: Tympanic membrane normal.     Left Ear: Tympanic membrane normal.     Nose: Nose normal.  Cardiovascular:     Rate and Rhythm: Regular rhythm. Tachycardia present.     Pulses: Normal pulses.     Heart sounds: Normal heart sounds.  Pulmonary:     Effort: Pulmonary effort is normal.     Breath sounds: Normal breath sounds.  Abdominal:     Palpations: Abdomen is soft.  Musculoskeletal:     Cervical back: Normal range  of motion and neck supple.  Lymphadenopathy:     Cervical: No cervical adenopathy.  Skin:    General: Skin is warm.     Coloration: Skin is not jaundiced or pale.     Findings: No erythema.  Neurological:     Mental Status: She is alert and oriented to person, place, and time.     UC Treatments / Results  Labs (all labs ordered are listed, but only abnormal results are displayed) Labs Reviewed  CBG MONITORING, ED    EKG   Radiology No results found.  Procedures Procedures (including critical care time)  Medications Ordered in UC Medications - No data to display  Initial Impression / Assessment and Plan / UC Course  I have reviewed the triage vital signs and the nursing notes.  Pertinent labs & imaging results that were available during my care of the patient were reviewed by me and considered in my medical decision making (see chart for details).     New. EKG and history concerning for STEMI. Question if her ear pain is actually radiating pain from her chest. No previous EKG in file. Discussed concerns with patient while starting IV and giving asa. Called EMS and alerted her daughter who is with her today. She will be transported as a code STEMI. Triage nurse in ER alerted to patients status and upcoming arrival.    Final Clinical Impressions(s) / UC Diagnoses   Final diagnoses:  None   Discharge Instructions   None    ED Prescriptions   None    PDMP not reviewed this encounter.   Hughie Closs, [place] 03/04/21 1535

## 2021-03-04 NOTE — Consult Note (Signed)
Cardiology Consultation:   Patient ID: Lori Rosario; [id number]; 10/20/1940   Admit date: 03/04/2021 Date of Consult: 03/04/2021  Primary Care Provider: Sofie Hartigan, MD Primary Cardiologist: New to [facility] - consult by End Primary Electrophysiologist:  None   Patient Profile:   Lori Rosario is a 80 y.o. female with a hx of hyperthyroidism s/p radioactive iodine therapy in there 17s now on replacement therapy who's husband recently passed away earlier this week who is being seen today for the evaluation of code STEMI at the request of Dr. Tamala Julian.  History of Present Illness:   Lori Rosario has no previously known cardiac disease. Her husband passed away earlier this week. Earlier today, she developed right otalgia and planned on seeing a local urgent care. Prior to leaving for her appointment, she developed substernal chest pain without radiation or associated symptoms. Pain lasted approximately 20 minutes with noted spontaneous resolution. She has been chest pain free since. No prior cardiac testing. While at the urgent care, for otalgia, she mentioned to them that she had an episode of chest pain earlier in the day. She was chest pain free at that time. In this setting, she had an EKG performed which showed sinus rhythm, 94 bpm, RBBB, 1-2 mm inferior st elevation without reciprocal changes. EMS was called, she was given ASA 324 mg in the field. Vitals stable in the field and upon arrival to [facility]. In the ED, she remained chest pain free and was resting comfortably on the EMS stretcher. Repeat EKG in the ED showed sinus rhythm with continued inferior st/t changes. She did not receive a heparin bolus. After interventional cardiology discussion with the patient, she elected to proceed with emergent LHC for potential STEMI.   Past Medical History:  Diagnosis Date   Thyroid disease     Past Surgical History:  Procedure Laterality Date   COLONOSCOPY  2013   cleared for 10 yrs- Dr @ [place] Meds: Prior to Admission medications   Medication Sig Start Date End Date Taking? Authorizing Provider  calcium citrate-vitamin D (CITRACAL+D) 315-200 MG-UNIT tablet Take 1 tablet by mouth daily.    [provider]  levothyroxine (SYNTHROID, LEVOTHROID) 75 MCG tablet Take 1 tablet (75 mcg total) by mouth daily. 03/17/16   Juline Patch, MD  Multiple Vitamins-Minerals (CENTRUM SILVER 50+WOMEN PO) Take 1 tablet by mouth daily.    [provider]    Inpatient Medications: Scheduled Meds:  Continuous Infusions:  PRN Meds:   Allergies:  No Known Allergies  Social History:   Social History   Socioeconomic History   Marital status: Married    Spouse name: Not on file   Number of children: Not on file   Years of education: Not on file   Highest education level: Not on file  Occupational History   Not on file  Tobacco Use   Smoking status: Never   Smokeless tobacco: Never  Vaping Use   Vaping Use: Never used  Substance and Sexual Activity   Alcohol use: No    Alcohol/week: 0.0 standard drinks   Drug use: No   Sexual activity: Not Currently  Other Topics Concern   Not on file  Social History Narrative   Not on file   Social Determinants of Health   Financial Resource Strain: Not on file  Food Insecurity: Not on file  Transportation Needs: Not on file  Physical  Activity: Not on file  Stress: Not on file  Social Connections: Not on file  Intimate Partner Violence: Not on file     Family History:   Family History  Problem Relation Age of Onset   Heart disease Father     ROS:  Review of Systems  Constitutional:  Positive for malaise/fatigue. Negative for chills, diaphoresis, fever and weight loss.  HENT:  Positive for ear pain. Negative for congestion.        R otalgia   Eyes:  Negative for discharge and redness.  Respiratory:  Negative for cough, sputum production, shortness of breath and wheezing.    Cardiovascular:  Positive for chest pain. Negative for palpitations, orthopnea, claudication, leg swelling and PND.  Gastrointestinal:  Negative for abdominal pain, heartburn, nausea and vomiting.  Musculoskeletal:  Negative for falls and myalgias.  Skin:  Negative for rash.  Neurological:  Negative for dizziness, tingling, tremors, sensory change, speech change, focal weakness, loss of consciousness and weakness.  Endo/Heme/Allergies:  Does not bruise/bleed easily.  Psychiatric/Behavioral:  Negative for substance abuse. The patient is not nervous/anxious.   All other systems reviewed and are negative.    Physical Exam/Data:   Vitals:   03/04/21 1610 03/04/21 1614  BP: 114/66   Pulse: 96   Resp: 18   Temp:  98.3 F (36.8 C)  TempSrc:  Oral  SpO2: 100%    No intake or output data in the 24 hours ending 03/04/21 1616 There were no vitals filed for this visit. There is no height or weight on file to calculate BMI.   Physical Exam: General: Well developed, well nourished, in no acute distress. Head: Normocephalic, atraumatic, sclera non-icteric, no xanthomas, nares without discharge.  Neck: Negative for carotid bruits. JVD not elevated. Lungs: Clear bilaterally to auscultation without wheezes, rales, or rhonchi. Breathing is unlabored. Heart: RRR with S1 S2. No murmurs, rubs, or gallops appreciated. Abdomen: Soft, non-tender, non-distended with normoactive bowel sounds. No hepatomegaly. No rebound/guarding. No obvious abdominal masses. Msk:  Strength and tone appear normal for age. Extremities: No clubbing or cyanosis. No edema. Distal pedal pulses are 2+ and equal bilaterally. Neuro: Alert and oriented X 3. No facial asymmetry. No focal deficit. Moves all extremities spontaneously. Psych:  Responds to questions appropriately with a normal affect.   EKG:  The EKG was personally reviewed and demonstrates: NSR, 94 bpm, RBBB, 1-2 mm inferior st elevation without reciprocal changes,  no prior for comparison  Telemetry:  Telemetry was personally reviewed and demonstrates: Sinus rhythm with sinus tachycardia, BBB  Weights: There were no vitals filed for this visit.  Relevant CV Studies:   None  Laboratory Data:  ChemistryNo results for input(s): NA, K, CL, CO2, GLUCOSE, BUN, CREATININE, CALCIUM, GFRNONAA, GFRAA, ANIONGAP in the last 168 hours.  No results for input(s): PROT, ALBUMIN, AST, ALT, ALKPHOS, BILITOT in the last 168 hours. HematologyNo results for input(s): WBC, RBC, HGB, HCT, MCV, MCH, MCHC, RDW, PLT in the last 168 hours. Cardiac EnzymesNo results for input(s): TROPONINI in the last 168 hours. No results for input(s): TROPIPOC in the last 168 hours.  BNPNo results for input(s): BNP, PROBNP in the last 168 hours.  DDimer No results for input(s): DDIMER in the last 168 hours.  Radiology/Studies:  No results found.  Assessment and Plan:   1. Possible inferior STEMI: -Currently chest pain free -Emergent LHC -Cycle HS-Tn to peak -Echo -ASA -Check lipid panel and A1c for further risk stratification  -Escalate GDMT as indicated -CXR -CBC,  CMP -Further recommendations pending LHC -Risks and benefits of cardiac catheterization have been discussed with the patient including risks of bleeding, bruising, infection, kidney damage, stroke, heart attack, and death. The patient understands these risks and is willing to proceed with the procedure. All questions have been answered and concerns listened to  2. Iatrogenic hypothyroidism: -TSH pending -She remains on replacement therapy    For questions or updates, please contact [place] Please consult [email or web address] for contact info under Cardiology/STEMI.   Signed, Christell Faith, PA-C [place] Pager: [phone] 03/04/2021, 4:16 PM

## 2021-03-04 NOTE — H&P (Signed)
[place]   PATIENT NAME: Lori Rosario    MR#:  [id number]  DATE OF BIRTH:  [birth year]  DATE OF ADMISSION:  03/04/2021  PRIMARY CARE PHYSICIAN: Sofie Hartigan, MD   Patient is coming from: Home  REQUESTING/REFERRING PHYSICIAN: End, Harrell Gave, MD CHIEF COMPLAINT:   Chief Complaint  Patient presents with   Code STEMI    HISTORY OF PRESENT ILLNESS:  Lori Rosario is a 80 y.o. Caucasian female with medical history significant for hyperthyroidism status post radioactive iodine ablation in her 10s followed by post ablative hypothyroidism and thyroid replacement therapy, whose husband died earlier this week and presented to the ER with acute onset of substernal chest pain without radiation started earlier today after leaving an appointment that she had a local urgent care for right otalgia.  Her pain lasted about 20 minutes.  Her EKG showed sinus rhythm with rate of 94 with right bundle branch block and 1 to 2 mm inferior ST segment elevation.  She was given 4 baby aspirin and received an IV heparin bolus in the ER.  After interventional cardiology discussion with the patient she elected to proceed with emergent LHC for potential STEMI.  She was found to have Takotsubo cardiomyopathy with EF of 20 to 25% and moderate nonobstructive LAD disease.  ED Course: His blood pressure was 96/72 with a heart rate of 95 and normal sinus vital signs otherwise.  Labs were remarkable for mild hyponatremia 134 and otherwise unremarkable CMP.  High-sensitivity troponin I was 3668 and LDL was 147 with total cholesterol of 234 and triglyceride 157 with HDL 56.  CBC was within normal.  Influenza antigens and COVID-19 PCR came back negative. EKG as reviewed by me : EKG showed initially showed sinus rhythm with a rate of 97 with first-degree AV block and inferior ST segment elevation with T wave inversion in V1 and V2, RVH and incomplete right bundle branch block as well as right superior axis  deviation.  The patient will be admitted to an ICU bed for further evaluation and management.Marland Kitchen PAST MEDICAL HISTORY:   Past Medical History:  Diagnosis Date   Thyroid disease   Postablative hypothyroidism.  PAST SURGICAL HISTORY:   Past Surgical History:  Procedure Laterality Date   COLONOSCOPY  2013   cleared for 10 yrs- Dr @ [facility]   INGUINAL HERNIA REPAIR      SOCIAL HISTORY:   Social History   Tobacco Use   Smoking status: Never   Smokeless tobacco: Never  Substance Use Topics   Alcohol use: No    Alcohol/week: 0.0 standard drinks    FAMILY HISTORY:   Family History  Problem Relation Age of Onset   Heart disease Father     DRUG ALLERGIES:  No Known Allergies  REVIEW OF SYSTEMS:   ROS As per history of present illness. All pertinent systems were reviewed above. Constitutional, HEENT, cardiovascular, respiratory, GI, GU, musculoskeletal, neuro, psychiatric, endocrine, integumentary and hematologic systems were reviewed and are otherwise negative/unremarkable except for positive findings mentioned above in the HPI.   MEDICATIONS AT HOME:   Prior to Admission medications   Medication Sig Start Date End Date Taking? Authorizing Provider  calcium citrate-vitamin D (CITRACAL+D) 315-200 MG-UNIT tablet Take 1 tablet by mouth daily.    [provider]  levothyroxine (SYNTHROID, LEVOTHROID) 75 MCG tablet Take 1 tablet (75 mcg total) by mouth daily. 03/17/16   Juline Patch, MD  Multiple Vitamins-Minerals (CENTRUM SILVER 50+WOMEN PO) Take  1 tablet by mouth daily.    [provider]      VITAL SIGNS:  Blood pressure (!) 98/53, pulse 91, temperature 98.1 F (36.7 C), temperature source Oral, resp. rate 16, SpO2 95 %.  PHYSICAL EXAMINATION:  Physical Exam  GENERAL:  80 y.o.-year-old Caucasian patient lying in the bed with no acute distress.  EYES: Pupils equal, round, reactive to light and accommodation. No scleral icterus. Extraocular muscles  intact.  HEENT: Head atraumatic, normocephalic. Oropharynx and nasopharynx clear.  NECK:  Supple, no jugular venous distention. No thyroid enlargement, no tenderness.  LUNGS: Normal breath sounds bilaterally, no wheezing, rales,rhonchi or crepitation. No use of accessory muscles of respiration.  CARDIOVASCULAR: Regular rate and rhythm, S1, S2 normal. No murmurs, rubs, or gallops.  ABDOMEN: Soft, nondistended, nontender. Bowel sounds present. No organomegaly or mass.  EXTREMITIES: No pedal edema, cyanosis, or clubbing.  NEUROLOGIC: Cranial nerves II through XII are intact. Muscle strength 5/5 in all extremities. Sensation intact. Gait not checked.  PSYCHIATRIC: The patient is alert and oriented x 3.  Normal affect and good eye contact. SKIN: No obvious rash, lesion, or ulcer.   LABORATORY PANEL:   CBC Recent Labs  Lab 03/04/21 1613  WBC 8.4  HGB 14.7  HCT 44.0  PLT 282   ------------------------------------------------------------------------------------------------------------------  Chemistries  No results for input(s): NA, K, CL, CO2, GLUCOSE, BUN, CREATININE, CALCIUM, MG, AST, ALT, ALKPHOS, BILITOT in the last 168 hours.  Invalid input(s): GFRCGP ------------------------------------------------------------------------------------------------------------------  Cardiac Enzymes No results for input(s): TROPONINI in the last 168 hours. ------------------------------------------------------------------------------------------------------------------  RADIOLOGY:  No results found.    IMPRESSION AND PLAN:  Principal Problem:   Takotsubo cardiomyopathy Active Problems:   ST elevation myocardial infarction (STEMI) ([place])   NSTEMI (non-ST elevated myocardial infarction) ([place])  1.  Takotsubo cardiomyopathy with nonobstructive coronary artery disease with initially suspected inferior STEMI. - The patient be admitted to an ICU bed. - She will be diuresed with IV Lasix. - Beta  blocker therapy will be provided with Toprol-XL. - She will be placed on statin therapy. - Cardiology follow-up consult.  Dr. Saunders Revel will be following with [place].  2.  Hypothyroidism. - We will continue her Synthroid.  DVT prophylaxis: Lovenox.  Code Status: full code.  Family Communication:  The plan of care was discussed in details with the patient (and her daughter who was with her in the room). I answered all questions. The patient agreed to proceed with the above mentioned plan. Further management will depend upon hospital course. Disposition Plan: Back to previous home environment Consults called: Cardiology All the records are reviewed and case discussed with ED provider.  The patient is admitted under inpatient status, due to the intensity of medical problems, potential risks involved and management requirement that will necessitate more than 2 midnights. - The patient is coming from: Home - The patient will be discharged: Home - The patient is currently not medically stable for discharge. - The patient should not have any problem with postdischarge placement.  TOTAL TIME TAKING CARE OF THIS PATIENT: 55 minutes.    Christel Mormon M.D on 03/04/2021 at 5:19 PM  [facility]   From 7 PM-7 AM, contact night-coverage [email or web address]  CC: Primary care physician; Sofie Hartigan, MD

## 2021-03-04 NOTE — ED Triage Notes (Signed)
Patient arrives via [facility] from a clinic in [place] where she was being seen for ear pain. Patient complained of CP this morning but it resolved on it's own. Patient denies CP at this time.

## 2021-03-04 NOTE — ED Notes (Signed)
Patient is being discharged from the Urgent Care and sent to the [facility] Emergency Department via EMS . Per Nelwyn Salisbury, PA, patient is in need of higher level of care due to abnormal EKG and to R/o MI. Patient is aware and verbalizes understanding of plan of care.  Vitals:   03/04/21 1451  BP: 115/70  Pulse: (!) 105  Resp: 14  Temp: 98.7 F (37.1 C)  SpO2: 97%

## 2021-03-04 NOTE — ED Provider Notes (Signed)
[facility] Emergency Department Provider Note  ____________________________________________   Event Date/Time   First MD Initiated Contact with Patient 03/04/21 1614     (approximate)  I have reviewed the triage vital signs and the nursing notes.   HISTORY  Chief Complaint Code STEMI   HPI Lori Rosario is a 80 y.o. female history of thyroid disease and HDL who presents with EMS from urgent care where she went for assessment of some chest pain and dizziness that occurred earlier today.  Patient states that last about 30 minutes and felt like a tightness in her chest.  She initially went to urgent care because she was having some pain in her right ear that resolved on was wondering if that could be causing her dizziness.  She has never had chest pain like she had earlier and now 0/10 intensity.  She did receive 304 mg of ASA.  She denies any recent injuries or falls, fevers, cough, vomiting, diarrhea, dysuria, rash, abdominal pain, back pain or other clear acute sick symptoms although does note she recently asked her husband last week.  She has no other concerns at this time.         Past Medical History:  Diagnosis Date  . Thyroid disease     Patient Active Problem List   Diagnosis Date Noted  . Dyslipidemia 03/04/2015  . Dizziness 02/10/2014  . Adult hypothyroidism 02/19/2012    Past Surgical History:  Procedure Laterality Date  . COLONOSCOPY  2013   cleared for 10 yrs- Dr @ [facility]  . INGUINAL HERNIA REPAIR      Prior to Admission medications   Medication Sig Start Date End Date Taking? Authorizing Provider  calcium citrate-vitamin D (CITRACAL+D) 315-200 MG-UNIT tablet Take 1 tablet by mouth daily.    [provider]  levothyroxine (SYNTHROID, LEVOTHROID) 75 MCG tablet Take 1 tablet (75 mcg total) by mouth daily. 03/17/16   Juline Patch, MD  Multiple Vitamins-Minerals (CENTRUM SILVER 50+WOMEN PO) Take 1 tablet by mouth daily.     [provider]    Allergies Patient has no known allergies.  Family History  Problem Relation Age of Onset  . Heart disease Father     Social History Social History   Tobacco Use  . Smoking status: Never  . Smokeless tobacco: Never  Vaping Use  . Vaping Use: Never used  Substance Use Topics  . Alcohol use: No    Alcohol/week: 0.0 standard drinks  . Drug use: No    Review of Systems  Review of Systems  Constitutional:  Negative for chills and fever.  HENT:  Positive for ear pain. Negative for sore throat.   Eyes:  Negative for pain.  Respiratory:  Negative for cough and stridor.   Cardiovascular:  Positive for chest pain.  Gastrointestinal:  Negative for vomiting.  Genitourinary:  Negative for dysuria.  Musculoskeletal:  Negative for myalgias.  Skin:  Negative for rash.  Neurological:  Positive for dizziness. Negative for seizures, loss of consciousness and headaches.  Psychiatric/Behavioral:  Negative for suicidal ideas.   All other systems reviewed and are negative.    ____________________________________________   PHYSICAL EXAM:  VITAL SIGNS: ED Triage Vitals  Enc Vitals Group     BP 03/04/21 1610 114/66     Pulse Rate 03/04/21 1610 96     Resp 03/04/21 1610 18     Temp --      Temp src --      SpO2  03/04/21 1610 100 %     Weight --      Height --      Head Circumference --      Peak Flow --      Pain Score 03/04/21 1608 0     Pain Loc --      Pain Edu? --      Excl. in [place]? --    Vitals:   03/04/21 1610 03/04/21 1614  BP: 114/66   Pulse: 96   Resp: 18   Temp:  98.3 F (36.8 C)  SpO2: 100%    Physical Exam Vitals and nursing note reviewed.  Constitutional:      General: She is not in acute distress.    Appearance: She is well-developed.  HENT:     Head: Normocephalic and atraumatic.     Right Ear: External ear normal.     Left Ear: External ear normal.     Nose: Nose normal.  Eyes:     Conjunctiva/sclera: Conjunctivae  normal.  Cardiovascular:     Rate and Rhythm: Regular rhythm. Tachycardia present.     Heart sounds: No murmur heard. Pulmonary:     Effort: Pulmonary effort is normal. No respiratory distress.     Breath sounds: Normal breath sounds.  Abdominal:     Palpations: Abdomen is soft.     Tenderness: There is no abdominal tenderness.  Musculoskeletal:     Cervical back: Neck supple.     Right lower leg: No edema.     Left lower leg: No edema.  Skin:    General: Skin is warm and dry.     Capillary Refill: Capillary refill takes less than 2 seconds.  Neurological:     Mental Status: She is alert and oriented to person, place, and time.    TMs unremarkable bilaterally.  No evidence of acute otitis media mastoiditis or meningitis ____________________________________________   LABS (all labs ordered are listed, but only abnormal results are displayed)  Labs Reviewed  RESP PANEL BY RT-PCR (FLU A&B, COVID) ARPGX2  CBC WITH DIFFERENTIAL/PLATELET  COMPREHENSIVE METABOLIC PANEL  MAGNESIUM  TSH  LIPID PANEL  HEMOGLOBIN A1C  TROPONIN I (HIGH SENSITIVITY)   ____________________________________________  EKG  Sinus rhythm with ventricular rate of 94, incomplete right bundle branch block and ST elevations in lead III, lead II and aVF as well as lateral leads V5, V4 and V6 concerning for STEMI. ____________________________________________  RADIOLOGY  ED MD interpretation:    Official radiology report(s): No results found.  ____________________________________________   PROCEDURES  Procedure(s) performed (including Critical Care):  .1-3 Lead EKG Interpretation  Date/Time: 03/04/2021 4:18 PM Performed by: Lucrezia Starch, MD Authorized by: Lucrezia Starch, MD     Interpretation: non-specific     ECG rate assessment: tachycardic     Rhythm: sinus tachycardia     Ectopy: none     Conduction: abnormal   .Critical Care  Date/Time: 03/04/2021 4:19 PM Performed by: Lucrezia Starch, MD Authorized by: Lucrezia Starch, MD   Critical care provider statement:    Critical care time (minutes):  10   Critical care was necessary to treat or prevent imminent or life-threatening deterioration of the following conditions:  Cardiac failure   Critical care was time spent personally by me on the following activities:  Discussions with consultants, evaluation of patient's response to treatment, examination of patient, ordering and performing treatments and interventions, ordering and review of laboratory studies, ordering and review of radiographic studies,  pulse oximetry, re-evaluation of patient's condition, obtaining history from patient or surrogate and review of old charts   ____________________________________________   INITIAL IMPRESSION / [place] / ED COURSE      Patient presents as a code STEMI activated by EMS after they were called to urgent care or patient was being evaluated for some chest pain in the setting of some right earache and dizziness that started earlier today.  By time she is arrived emergency room she is chest pain-free.  She is afebrile hemodynamically stable although intermittently slightly tachycardic.  Her lungs are clear bilaterally abdomen is soft.  Cardiology immediately at bedside to consent patient for cath.  TMs unremarkable bilaterally.  No evidence of acute otitis media mastoiditis or meningitis.  Clinically allergy avoidance or ear pain at this time.  Sinus rhythm with ventricular rate of 94, incomplete right bundle branch block and ST elevations in lead III, lead II and aVF as well as lateral leads V5, V4 and V6 concerning for STEMI.  Patient is protecting her airway and is hemodynamically stable.  She was taken emergently to Cath Lab.  Per Dr. Saunders Revel o he does not recommend heparin at this time.     ____________________________________________   FINAL CLINICAL IMPRESSION(S) / ED DIAGNOSES  Final diagnoses:  ST  elevation myocardial infarction (STEMI), unspecified artery ([place])    Medications - No data to display   ED Discharge Orders     None        Note:  This document was prepared using Dragon voice recognition software and may include unintentional dictation errors.    Lucrezia Starch, MD 03/04/21 [phone]

## 2021-03-04 NOTE — Brief Op Note (Signed)
BRIEF CARDIAC CATHETERIZATION NOTE  03/04/2021  5:19 PM  PATIENT:  Lori Rosario  80 y.o. female  PRE-OPERATIVE DIAGNOSIS:  STEMI  POST-OPERATIVE DIAGNOSIS:  Takotsubo cardiomyopathy  PROCEDURE:  Procedure(s): LEFT HEART CATH AND CORONARY ANGIOGRAPHY (N/A)  SURGEON:  Surgeon(s) and Role:    * Desjuan Stearns, MD - Primary  FINDINGS: Non-obstructive CAD with up to 50-60% stenosis involving the proximal LAD. Severely reduced left ventricular systolic function with mid/apical hypo/akinesis consistent with Takotsubo cardiomyopathy. Moderately to severely elevated left ventricular filling pressure.  RECOMMENDATIONS: Admit to ICU for continued monitoring/therapy. Gentle diursis; start furosemide 20 mg IV daily with titration based on urine output. Add metoprolol succinate 12.5 mg QHS; consider adding ACEI/ARB tomorrow if BP and renal function allow. Obtain echocardiogram with special attention for LV thrombus. Medical therapy of LAD disease.  Nelva Bush, MD [facility]

## 2021-03-05 ENCOUNTER — Inpatient Hospital Stay (HOSPITAL_BASED_OUTPATIENT_CLINIC_OR_DEPARTMENT_OTHER)
Admit: 2021-03-05 | Discharge: 2021-03-05 | Disposition: A | Discharge status: Home / Self Care | Payer: PPO | Attending: Cardiovascular Disease | Admitting: Cardiovascular Disease

## 2021-03-05 DIAGNOSIS — I5181 Takotsubo syndrome: Secondary | ICD-10-CM | POA: Diagnosis not present

## 2021-03-05 DIAGNOSIS — I214 Non-ST elevation (NSTEMI) myocardial infarction: Secondary | ICD-10-CM | POA: Diagnosis not present

## 2021-03-05 DIAGNOSIS — E039 Hypothyroidism, unspecified: Secondary | ICD-10-CM | POA: Diagnosis not present

## 2021-03-05 DIAGNOSIS — E785 Hyperlipidemia, unspecified: Secondary | ICD-10-CM

## 2021-03-05 DIAGNOSIS — I5021 Acute systolic (congestive) heart failure: Secondary | ICD-10-CM | POA: Diagnosis not present

## 2021-03-05 LAB — BASIC METABOLIC PANEL
Anion gap: 8 (ref 5–15)
BUN: 18 mg/dL (ref 8–23)
CO2: 26 mmol/L (ref 22–32)
Calcium: 9 mg/dL (ref 8.9–10.3)
Chloride: 103 mmol/L (ref 98–111)
Creatinine, Ser: 0.6 mg/dL (ref 0.44–1.00)
GFR, Estimated: >60 mL/min (ref >60)
Glucose, Bld: 104 mg/dL — ABNORMAL HIGH (ref 70–99)
Potassium: 3.9 mmol/L (ref 3.5–5.1)
Sodium: 137 mmol/L (ref 135–145)

## 2021-03-05 LAB — CBC
HCT: 44.6 % (ref 36.0–46.0)
Hemoglobin: 15.3 g/dL — ABNORMAL HIGH (ref 12.0–15.0)
MCH: 30.7 pg (ref 26.0–34.0)
MCHC: 34.3 g/dL (ref 30.0–36.0)
MCV: 89.6 fL (ref 80.0–100.0)
Platelets: 275 10*3/uL (ref 150–400)
RBC: 4.98 MIL/uL (ref 3.87–5.11)
RDW: 13.7 % (ref 11.5–15.5)
WBC: 7.8 10*3/uL (ref 4.0–10.5)
nRBC: 0 % (ref 0.0–0.2)

## 2021-03-05 LAB — ECHOCARDIOGRAM COMPLETE
AR max vel: 2.46 cm2
AV Peak grad: 4.9 mmHg
Ao pk vel: 1.11 m/s
Area-P 1/2: 6.65 cm2
Calc EF: 33 %
Height: 62 in
S' Lateral: 2.44 cm
Single Plane A2C EF: 33.5 %
Single Plane A4C EF: 31.2 %
Weight: 1753.1 oz

## 2021-03-05 MED ORDER — CHLORHEXIDINE GLUCONATE CLOTH 2 % EX PADS
6.0000 | MEDICATED_PAD | Freq: Every day | CUTANEOUS | Status: DC
  Administered 2021-03-05: 6 via TOPICAL

## 2021-03-05 MED ORDER — ATORVASTATIN CALCIUM 40 MG PO TABS: 40.0000 mg | ORAL_TABLET | Freq: Every day | ORAL | 0 refills | Status: DC

## 2021-03-05 MED ORDER — ASPIRIN 81 MG PO TBEC: 81.0000 mg | DELAYED_RELEASE_TABLET | Freq: Every day | ORAL | 0 refills | Status: DC

## 2021-03-05 MED ORDER — METOPROLOL SUCCINATE ER 25 MG PO TB24: 12.5000 mg | ORAL_TABLET | Freq: Every day | ORAL | 0 refills | Status: DC

## 2021-03-05 MED ORDER — FUROSEMIDE 20 MG PO TABS: 20.0000 mg | ORAL_TABLET | Freq: Every day | ORAL | 0 refills | Status: DC | PRN

## 2021-03-05 MED ORDER — FUROSEMIDE 20 MG PO TABS: 20.0000 mg | ORAL_TABLET | Freq: Every day | ORAL | Status: DC | PRN

## 2021-03-05 MED ORDER — LEVOTHYROXINE SODIUM 75 MCG PO TABS: 68.5000 ug | ORAL_TABLET | Freq: Every day | ORAL | Status: DC

## 2021-03-05 MED ORDER — PERFLUTREN LIPID MICROSPHERE
1.0000 mL | INTRAVENOUS | Status: AC | PRN
  Administered 2021-03-05: 4.5 mL via INTRAVENOUS
  Filled 2021-03-05: qty 10

## 2021-03-05 NOTE — Care Management CC44 (Signed)
Condition Code 44 Documentation Completed  Patient Details  Name: Lori Rosario MRN: [id number] Date of Birth: 11-13-1940   Condition Code 44 given:  Yes Patient signature on Condition Code 44 notice:  Yes Documentation of 2 MD's agreement:  Yes Code 44 added to claim:  Yes    Sheana Bir I Kamauri Denardo, LCSW 03/05/2021, 1:52 PM

## 2021-03-05 NOTE — Progress Notes (Signed)
Follow up from previous chaplain visit. Patient awake and sitting up, with daughters bedside. Patient states; everyone has been so wonderful in their care toward her. Patient shared her husband just died a week ago and she requested prayer for her and her family. Patient is looking forward to being released later today.

## 2021-03-05 NOTE — Progress Notes (Signed)
Patient ID: Lori Rosario, female   DOB: 05-08-1941, 80 y.o.   MRN: [id number] TTE being done at bedside No mural apical thrombus BP too low for ACE PRN lasix only  Low dose Toprol to take in evening prior to bed BP low 90-95 mmHg patient ambulatory with no dizziness Ok to d/c home   Will arrange outpatient f/u with Dr End  Jenkins Rouge MD [facility]

## 2021-03-05 NOTE — Progress Notes (Addendum)
Progress Note  Patient Name: Lori Rosario Date of Encounter: 03/05/2021  Primary Cardiologist: New to [facility] - consult by End  Subjective   No chest pain or dyspnea.   Inpatient Medications    Scheduled Meds:  aspirin EC  81 mg Oral Daily   atorvastatin  40 mg Oral Daily   Chlorhexidine Gluconate Cloth  6 each Topical Daily   enoxaparin (LOVENOX) injection  40 mg Subcutaneous Q24H   furosemide  20 mg Intravenous Daily   levothyroxine  75 mcg Oral Q0600   metoprolol succinate  12.5 mg Oral Daily   sodium chloride flush  3 mL Intravenous Q12H   Continuous Infusions:  sodium chloride     PRN Meds: sodium chloride, acetaminophen, ondansetron (ZOFRAN) IV, sodium chloride flush   Vital Signs    Vitals:   03/05/21 0700 03/05/21 0730 03/05/21 0830 03/05/21 0900  BP: 99/60 102/63 97/60 97/76   Pulse: 87 82 97 95  Resp: 16 16 20  (!) 27  Temp:    98 F (36.7 C)  TempSrc:    Oral  SpO2: 96% 97% 96% 96%  Weight:      Height:        Intake/Output Summary (Last 24 hours) at 03/05/2021 0921 Last data filed at 03/04/2021 2153 Gross per 24 hour  Intake 120 ml  Output 1100 ml  Net -980 ml   Filed Weights   03/04/21 1718  Weight: 49.7 kg    Telemetry    SR - Personally Reviewed  ECG    No new tracings - Personally Reviewed  Physical Exam   GEN: No acute distress.   Neck: No JVD. Cardiac: RRR, no murmurs, rubs, or gallops.  Respiratory: Clear to auscultation bilaterally.  GI: Soft, nontender, non-distended.   MS: No edema; No deformity. Neuro:  Alert and oriented x 3; Nonfocal.  Psych: Normal affect.  Labs    Chemistry Recent Labs  Lab 03/04/21 1613 03/05/21 0546  NA 134* 137  K 4.5 3.9  CL 103 103  CO2 25 26  GLUCOSE 98 104*  BUN 19 18  CREATININE 0.73 0.60  CALCIUM 9.6 9.0  PROT 6.8  --   ALBUMIN 3.7  --   AST 39  --   ALT 18  --   ALKPHOS 58  --   BILITOT 0.7  --   GFRNONAA >60 >60  ANIONGAP 6 8     Hematology Recent Labs  Lab  03/04/21 1613 03/05/21 0546  WBC 8.4 7.8  RBC 4.86 4.98  HGB 14.7 15.3*  HCT 44.0 44.6  MCV 90.5 89.6  MCH 30.2 30.7  MCHC 33.4 34.3  RDW 13.4 13.7  PLT 282 275    Cardiac EnzymesNo results for input(s): TROPONINI in the last 168 hours. No results for input(s): TROPIPOC in the last 168 hours.   BNPNo results for input(s): BNP, PROBNP in the last 168 hours.   DDimer No results for input(s): DDIMER in the last 168 hours.   Radiology    N/A  Cardiac Studies   LHC 03/04/2021: Conclusions: Moderate single-vessel coronary artery disease with 50 to 60% proximal LAD stenosis. Severely reduced LVEF with mid and apical hypokinesis/akinesis and hyperdynamic basilar contraction.  Findings are consistent with stress-induced cardiomyopathy. Moderately-severely elevated left ventricular filling pressure (LVEDP ~35 mmHg).   Recommendations: Admit to ICU for monitoring and medical optimization. Gentle diuresis. Add low-dose metoprolol as blood pressure tolerates. If blood pressure and renal function allow, consider addition of ACE inhibitor/ARB  as soon as tomorrow. Aspirin and statin therapy to prevent progression of LAD disease. Obtain echocardiogram tomorrow with particular attention to the LV apex, as the patient is at risk for thrombus formation. __________  2D echo pending  Patient Profile     80 y.o. female with history of hyperthyroidism s/p radioactive iodine therapy in there 85s now on replacement therapy who's husband recently passed away earlier this week who is being seen today for the evaluation of code STEMI at the request of Dr. Tamala Julian.  Assessment & Plan    1. Takotsubo cardiomyopathy: -Currently chest pain free -LHC 7/8 with moderate nonobstructive 1-vessel CAD as above -Echo pending, will need Definity to assess for apical thrombus -ASA -Toprol XL -Relative hypotension precludes addition of ACEi at this time, look to add in follow up if BP allows -Can take Lasix  10-20 mg prn at discharge  -Post cath instructions   2. HLD: -LDL 147 this admission, not on a statin at that time -Goal LDL < 70 -Lipitor -Outpatient follow up   3. Iatrogenic hypothyroidism: -TSH normal -She remains on replacement therapy   Dispo: -Once echo is complete and read (to assess for apical thrombus), likely discharge home today  -I will arrange follow up in our office   For questions or updates, please contact [facility] Please consult [email or web address] for contact info under Cardiology/STEMI.    Signed, Christell Faith, PA-C [place] Pager: [phone] 03/05/2021, 9:21 AM  Patient examined chart reviewed Discussed care with patient, daughters and PA. Exam with thin elderly female Lungs clear no murmur Abdomen benign no edema. She is pain free and wants to go home BP too low for ACE/ARB continue beta blocker and PRN lasix. TTE pending will try to expedite so she can be d/c home at her request. Need to r/o mural apical thrombus in setting of Takatsubo DCM  Jenkins Rouge MD [facility]

## 2021-03-05 NOTE — Progress Notes (Signed)
Patient provided with AVS. Medications & discharge instructions reviewed at length with patient and daughter Jeani Hawking at bedside. All questions answered.

## 2021-03-05 NOTE — Plan of Care (Signed)
  Problem: Education: Goal: Knowledge of General Education information will improve Description: Including pain rating scale, medication(s)/side effects and non-pharmacologic comfort measures Outcome: Adequate for Discharge   Problem: Health Behavior/Discharge Planning: Goal: Ability to manage health-related needs will improve Outcome: Adequate for Discharge   Problem: Clinical Measurements: Goal: Ability to maintain clinical measurements within normal limits will improve Outcome: Adequate for Discharge Goal: Will remain free from infection Outcome: Adequate for Discharge Goal: Diagnostic test results will improve Outcome: Adequate for Discharge Goal: Respiratory complications will improve Outcome: Adequate for Discharge Goal: Cardiovascular complication will be avoided Outcome: Adequate for Discharge   Problem: Activity: Goal: Risk for activity intolerance will decrease Outcome: Adequate for Discharge   Problem: Nutrition: Goal: Adequate nutrition will be maintained Outcome: Adequate for Discharge   Problem: Coping: Goal: Level of anxiety will decrease Outcome: Adequate for Discharge   

## 2021-03-05 NOTE — Progress Notes (Signed)
*  PRELIMINARY RESULTS* Echocardiogram 2D Echocardiogram has been performed. Definity IV Contrast used on this study.  Lori Rosario 03/05/2021, 12:27 PM

## 2021-03-05 NOTE — Discharge Summary (Signed)
[facility] NAME: Lori Rosario    MR#:  [id number]  DATE OF BIRTH:  [birth year]  DATE OF ADMISSION:  03/04/2021 ADMITTING PHYSICIAN: Loletha Grayer, MD  DATE OF DISCHARGE: 03/05/2021  1:50 PM  PRIMARY CARE PHYSICIAN: Sofie Hartigan, MD    ADMISSION DIAGNOSIS:  ST elevation myocardial infarction (STEMI), unspecified artery (HCC) [I21.3] NSTEMI (non-ST elevated myocardial infarction) ([place]) [I21.4] Takotsubo cardiomyopathy [I51.81]  DISCHARGE DIAGNOSIS:  Takotsubo cardiomyopathy Hypothyroidism unspecified Hyperlipidemia unspecified  SECONDARY DIAGNOSIS:   Past Medical History:  Diagnosis Date   Thyroid disease     HOSPITAL COURSE:   1.  Takotsubo cardiomyopathy.  The patient was admitted with a suspected STEMI and brought to the cardiac Cath Lab by Dr. Saunders Revel.  STEMI ruled out.  Cardiac cath noted severely reduced left ventricular systolic function with mid apical hypokinesis consistent with Takotsubo cardiomyopathy.  Patient did have nonobstructive proximal LAD lesion of 50 to 60%.  Echocardiogram showed an EF of 25 to 30%.  No mural apical thrombus seen.  Blood pressure too low for ACE inhibitor.  Prescribed low-dose Toprol at night.  Prescribed as needed Lasix for shortness of breath.  Aspirin and Lipitor prescribed. 2.  Hypothyroidism continue Synthroid 3.  Hyperlipidemia unspecified.  LDL 147 started on Lipitor.  DISCHARGE CONDITIONS:   Satisfactory  CONSULTS OBTAINED:  Treatment Team:  Nelva Bush, MD  DRUG ALLERGIES:  No Known Allergies  DISCHARGE MEDICATIONS:   Allergies as of 03/05/2021   No Known Allergies      Medication List     TAKE these medications    aspirin 81 MG EC tablet Take 1 tablet (81 mg total) by mouth daily. Swallow whole. Start taking on: March 06, 2021   atorvastatin 40 MG tablet Commonly known as: LIPITOR Take 1 tablet (40 mg total) by mouth daily. Start taking on: March 06, 2021   calcium citrate-vitamin D 315-200 MG-UNIT tablet Commonly known as: CITRACAL+D Take 1 tablet by mouth daily.   CENTRUM SILVER 50+WOMEN PO Take 1 tablet by mouth daily.   furosemide 20 MG tablet Commonly known as: LASIX Take 1 tablet (20 mg total) by mouth daily as needed for edema (or shortness of breath).   levothyroxine 75 MCG tablet Commonly known as: SYNTHROID Take 1 tablet (75 mcg total) by mouth daily before breakfast. What changed: when to take this   metoprolol succinate 25 MG 24 hr tablet Commonly known as: TOPROL-XL Take 0.5 tablets (12.5 mg total) by mouth at bedtime.         DISCHARGE INSTRUCTIONS:   Follow-up PMD 5 days Follow-up cardiology 1 week  If you experience worsening of your admission symptoms, develop shortness of breath, life threatening emergency, suicidal or homicidal thoughts you must seek medical attention immediately by calling 911 or calling your MD immediately  if symptoms less severe.  You Must read complete instructions/literature along with all the possible adverse reactions/side effects for all the Medicines you take and that have been prescribed to you. Take any new Medicines after you have completely understood and accept all the possible adverse reactions/side effects.   Please note  You were cared for by a hospitalist during your hospital stay. If you have any questions about your discharge medications or the care you received while you were in the hospital after you are discharged, you can call the unit and asked to speak with the hospitalist on call if the hospitalist that took care of you  is not available. Once you are discharged, your primary care physician will handle any further medical issues. Please note that NO REFILLS for any discharge medications will be authorized once you are discharged, as it is imperative that you return to your primary care physician (or establish a relationship with a primary care physician if you do  not have one) for your aftercare needs so that they can reassess your need for medications and monitor your lab values.    Today   CHIEF COMPLAINT:   Chief Complaint  Patient presents with   Code STEMI    HISTORY OF PRESENT ILLNESS:  Lori Rosario  is a 80 y.o. female admitted with chest pain and suspected STEMI.   VITAL SIGNS:  Blood pressure (!) 98/58, pulse 99, temperature 98 F (36.7 C), temperature source Oral, resp. rate (!) 23, height 5\' 2"  (1.575 m), weight 49.7 kg, SpO2 98 %.  I/O:   Intake/Output Summary (Last 24 hours) at 03/05/2021 1412 Last data filed at 03/04/2021 2153 Gross per 24 hour  Intake 120 ml  Output 1100 ml  Net -980 ml    PHYSICAL EXAMINATION:  GENERAL:  80 y.o.-year-old patient lying in the bed with no acute distress.  EYES: Pupils equal, round, reactive to light and accommodation. No scleral icterus. Extraocular muscles intact.  HEENT: Head atraumatic, normocephalic. Oropharynx and nasopharynx clear.  LUNGS: Normal breath sounds bilaterally, no wheezing, rales,rhonchi or crepitation. No use of accessory muscles of respiration.  CARDIOVASCULAR: S1, S2 normal. No murmurs, rubs, or gallops.  ABDOMEN: Soft, non-tender, non-distended.  EXTREMITIES: No pedal edema.  NEUROLOGIC: Cranial nerves II through XII are intact. Muscle strength 5/5 in all extremities. Sensation intact. Gait not checked.  PSYCHIATRIC: The patient is alert and oriented x 3.  SKIN: No obvious rash, lesion, or ulcer.   DATA REVIEW:   CBC Recent Labs  Lab 03/05/21 0546  WBC 7.8  HGB 15.3*  HCT 44.6  PLT 275    Chemistries  Recent Labs  Lab 03/04/21 1613 03/05/21 0546  NA 134* 137  K 4.5 3.9  CL 103 103  CO2 25 26  GLUCOSE 98 104*  BUN 19 18  CREATININE 0.73 0.60  CALCIUM 9.6 9.0  MG 2.7*  --   AST 39  --   ALT 18  --   ALKPHOS 58  --   BILITOT 0.7  --      Microbiology Results  Results for orders placed or performed during the hospital encounter of 03/04/21   Resp Panel by RT-PCR (Flu A&B, Covid) Nasopharyngeal Swab     Status: None   Collection Time: 03/04/21  4:15 PM   Specimen: Nasopharyngeal Swab; Nasopharyngeal(NP) swabs in vial transport medium  Result Value Ref Range Status   SARS Coronavirus 2 by RT PCR NEGATIVE NEGATIVE Final    Comment: (NOTE) SARS-CoV-2 target nucleic acids are NOT DETECTED.  The SARS-CoV-2 RNA is generally detectable in upper respiratory specimens during the acute phase of infection. The lowest concentration of SARS-CoV-2 viral copies this assay can detect is 138 copies/mL. A negative result does not preclude SARS-Cov-2 infection and should not be used as the sole basis for treatment or other patient management decisions. A negative result may occur with  improper specimen collection/handling, submission of specimen other than nasopharyngeal swab, presence of viral mutation(s) within the areas targeted by this assay, and inadequate number of viral copies(<138 copies/mL). A negative result must be combined with clinical observations, patient history, and epidemiological information. The expected  result is Negative.  Fact Sheet for Patients:  EntrepreneurPulse.com.au  Fact Sheet for Healthcare Providers:  IncredibleEmployment.be  This test is no t yet approved or cleared by the [place] FDA and  has been authorized for detection and/or diagnosis of SARS-CoV-2 by FDA under an Emergency Use Authorization (EUA). This EUA will remain  in effect (meaning this test can be used) for the duration of the COVID-19 declaration under Section 564(b)(1) of the Act, 21 U.S.C.section 360bbb-3(b)(1), unless the authorization is terminated  or revoked sooner.       Influenza A by PCR NEGATIVE NEGATIVE Final   Influenza B by PCR NEGATIVE NEGATIVE Final    Comment: (NOTE) The Xpert Xpress SARS-CoV-2/FLU/RSV plus assay is intended as an aid in the diagnosis of influenza from  Nasopharyngeal swab specimens and should not be used as a sole basis for treatment. Nasal washings and aspirates are unacceptable for Xpert Xpress SARS-CoV-2/FLU/RSV testing.  Fact Sheet for Patients: EntrepreneurPulse.com.au  Fact Sheet for Healthcare Providers: IncredibleEmployment.be  This test is not yet approved or cleared by the [place] FDA and has been authorized for detection and/or diagnosis of SARS-CoV-2 by FDA under an Emergency Use Authorization (EUA). This EUA will remain in effect (meaning this test can be used) for the duration of the COVID-19 declaration under Section 564(b)(1) of the Act, 21 U.S.C. section 360bbb-3(b)(1), unless the authorization is terminated or revoked.  Performed at [facility], [address] [place], [place] [address]   MRSA Next Gen by PCR, Nasal     Status: None   Collection Time: 03/04/21  6:05 PM   Specimen: Nasal Mucosa; Nasal Swab  Result Value Ref Range Status   MRSA by PCR Next Gen NOT DETECTED NOT DETECTED Final    Comment: (NOTE) The GeneXpert MRSA Assay (FDA approved for NASAL specimens only), is one component of a comprehensive MRSA colonization surveillance program. It is not intended to diagnose MRSA infection nor to guide or monitor treatment for MRSA infections. Test performance is not FDA approved in patients less than 11 years old. Performed at [facility], [address], [place], [place] [address]     RADIOLOGY:  CARDIAC CATHETERIZATION  Result Date: 03/04/2021 Conclusions: 1. Moderate single-vessel coronary artery disease with 50 to 60% proximal LAD stenosis. 2. Severely reduced LVEF with mid and apical hypokinesis/akinesis and hyperdynamic basilar contraction.  Findings are consistent with stress-induced cardiomyopathy. 3. Moderately-severely elevated left ventricular filling pressure (LVEDP ~35 mmHg). Recommendations: 1. Admit to ICU for monitoring and  medical optimization. 2. Gentle diuresis. 3. Add low-dose metoprolol as blood pressure tolerates. 4. If blood pressure and renal function allow, consider addition of ACE inhibitor/ARB as soon as tomorrow. 5. Aspirin and statin therapy to prevent progression of LAD disease. 6. Obtain echocardiogram tomorrow with particular attention to the LV apex, as the patient is at risk for thrombus formation. Nelva Bush, MD [facility]   ECHOCARDIOGRAM COMPLETE  Result Date: 03/05/2021    ECHOCARDIOGRAM REPORT   Patient Name:   QUYNN VILCHIS Date of Exam: 03/05/2021 Medical Rec #:  [id number]     Height:       62.0 in Accession #:    [id number]    Weight:       109.6 lb Date of Birth:  08/10/1941     BSA:          1.480 m Patient Age:    8 years      BP:  95/64 mmHg Patient Gender: F             HR:           90 bpm. Exam Location:  [facility] Procedure: 2D Echo and Intracardiac Opacification Agent Indications:     CHF I50.21  History:         Patient has no prior history of Echocardiogram examinations.  Sonographer:     Kathlen Brunswick RDCS Referring Phys:  [place] Diagnosing Phys: Jenkins Rouge MD IMPRESSIONS  1. Findings consistent with Takatsubo stress induced DCM. Akinesis of mid and apical walls with hyperdyanamic basal function No mural apical thrombus with or without definity . Left ventricular ejection fraction, by estimation, is 25 to 30%. The left ventricle has severely decreased function. The left ventricle has no regional wall motion abnormalities. Left ventricular diastolic parameters are consistent with Grade I diastolic dysfunction (impaired relaxation).  2. Right ventricular systolic function is normal. The right ventricular size is normal.  3. The pericardial effusion is surrounding the apex and anterior to the right ventricle.  4. The mitral valve is abnormal. Trivial mitral valve regurgitation. No evidence of mitral stenosis.  5. The aortic valve is tricuspid. There is mild  calcification of the aortic valve. Aortic valve regurgitation is not visualized. Mild aortic valve sclerosis is present, with no evidence of aortic valve stenosis.  6. The inferior vena cava is normal in size with greater than 50% respiratory variability, suggesting right atrial pressure of 3 mmHg. FINDINGS  Left Ventricle: Findings consistent with Takatsubo stress induced DCM. Akinesis of mid and apical walls with hyperdyanamic basal function No mural apical thrombus with or without definity. Left ventricular ejection fraction, by estimation, is 25 to 30%.  The left ventricle has severely decreased function. The left ventricle has no regional wall motion abnormalities. Definity contrast agent was given IV to delineate the left ventricular endocardial borders. The left ventricular internal cavity size was normal in size. There is no left ventricular hypertrophy. Left ventricular diastolic parameters are consistent with Grade I diastolic dysfunction (impaired relaxation). Right Ventricle: The right ventricular size is normal. No increase in right ventricular wall thickness. Right ventricular systolic function is normal. Left Atrium: Left atrial size was normal in size. Right Atrium: Right atrial size was normal in size. Pericardium: Trivial pericardial effusion is present. The pericardial effusion is surrounding the apex and anterior to the right ventricle. Mitral Valve: The mitral valve is abnormal. There is mild thickening of the mitral valve leaflet(s). There is mild calcification of the mitral valve leaflet(s). Mild mitral annular calcification. Trivial mitral valve regurgitation. No evidence of mitral valve stenosis. Tricuspid Valve: The tricuspid valve is normal in structure. Tricuspid valve regurgitation is not demonstrated. No evidence of tricuspid stenosis. Aortic Valve: The aortic valve is tricuspid. There is mild calcification of the aortic valve. Aortic valve regurgitation is not visualized. Mild aortic  valve sclerosis is present, with no evidence of aortic valve stenosis. Aortic valve peak gradient measures 4.9 mmHg. Pulmonic Valve: The pulmonic valve was normal in structure. Pulmonic valve regurgitation is not visualized. No evidence of pulmonic stenosis. Aorta: The aortic root is normal in size and structure. Venous: The inferior vena cava is normal in size with greater than 50% respiratory variability, suggesting right atrial pressure of 3 mmHg. IAS/Shunts: No atrial level shunt detected by color flow Doppler.  LEFT VENTRICLE PLAX 2D LVIDd:         3.14 cm     Diastology LVIDs:  2.44 cm     LV e' medial:    5.87 cm/s LV PW:         1.06 cm     LV E/e' medial:  21.3 LV IVS:        1.03 cm     LV e' lateral:   11.10 cm/s LVOT diam:     1.80 cm     LV E/e' lateral: 11.3 LV SV:         48 LV SV Index:   32 LVOT Area:     2.54 cm  LV Volumes (MOD) LV vol d, MOD A2C: 55.0 ml LV vol d, MOD A4C: 48.7 ml LV vol s, MOD A2C: 36.6 ml LV vol s, MOD A4C: 33.5 ml LV SV MOD A2C:     18.4 ml LV SV MOD A4C:     48.7 ml LV SV MOD BP:      18.1 ml RIGHT VENTRICLE RV Basal diam:  2.09 cm RV S prime:     8.05 cm/s TAPSE (M-mode): 1.7 cm LEFT ATRIUM           Index       RIGHT ATRIUM          Index LA diam:      3.00 cm 2.03 cm/m  RA Area:     5.05 cm LA Vol (A2C): 28.1 ml 18.98 ml/m RA Volume:   6.50 ml  4.39 ml/m LA Vol (A4C): 14.3 ml 9.66 ml/m  AORTIC VALVE                PULMONIC VALVE AV Area (Vmax): 2.46 cm    PV Vmax:       0.87 m/s AV Vmax:        110.70 cm/s PV Peak grad:  3.0 mmHg AV Peak Grad:   4.9 mmHg LVOT Vmax:      107.00 cm/s LVOT Vmean:     70.000 cm/s LVOT VTI:       0.187 m  AORTA Ao Root diam: 2.80 cm MITRAL VALVE                TRICUSPID VALVE MV Area (PHT): 6.65 cm     TV Peak grad:   26.0 mmHg MV Decel Time: 114 msec     TV Vmax:        2.55 m/s MV E velocity: 125.00 cm/s MV A velocity: 31.00 cm/s   SHUNTS MV E/A ratio:  4.03         Systemic VTI:  0.19 m                             Systemic  Diam: 1.80 cm Jenkins Rouge MD Electronically signed by Jenkins Rouge MD Signature Date/Time: 03/05/2021/12:46:14 PM    Final     Management plans discussed with the patient, family and they are in agreement.  CODE STATUS:     Code Status Orders  (From admission, onward)           Start     Ordered   03/04/21 1736  Full code  Continuous        03/04/21 1735           Code Status History     This patient has a current code status but no historical code status.       TOTAL TIME TAKING CARE OF THIS PATIENT: 35 minutes.    Kiowa Hollar  Ledonna Dormer M.D on 03/05/2021 at 2:12 PM  Between 7am to 6pm - Pager - [phone]  After 6pm go to [email or web address] - password [email or web address]  [facility]  CC: Primary care physician; Sofie Hartigan, MD

## 2021-03-07 ENCOUNTER — Encounter: Payer: Self-pay | Admitting: Internal Medicine

## 2021-03-07 LAB — GLUCOSE, CAPILLARY: Glucose-Capillary: 100 mg/dL — ABNORMAL HIGH (ref 70–99)

## 2021-03-08 LAB — HEMOGLOBIN A1C
Hgb A1c MFr Bld: 5.8 % — ABNORMAL HIGH (ref 4.8–5.6)
Mean Plasma Glucose: 119.76 mg/dL

## 2021-03-24 DIAGNOSIS — I5181 Takotsubo syndrome: Secondary | ICD-10-CM | POA: Diagnosis not present

## 2021-03-24 DIAGNOSIS — E871 Hypo-osmolality and hyponatremia: Secondary | ICD-10-CM | POA: Diagnosis not present

## 2021-03-31 ENCOUNTER — Encounter: Payer: Self-pay | Admitting: Physician Assistant

## 2021-03-31 NOTE — Progress Notes (Signed)
Cardiology Office Note    Date:  04/06/2021   ID:  Lori, Rosario 11/10/1940, MRN [id number]  PCP:  Lori Hartigan, MD  Cardiologist:  Nelva Bush, MD  Electrophysiologist:  None   Chief Complaint: Hospital follow-up  History of Present Illness:   Lori Rosario is a 80 y.o. female with history of CAD, HFrEF secondary to stress-induced cardiomyopathy, hyperthyroidism status post radioactive iodine therapy in her 65s now on replacement therapy, HLD who's husband passed away in early 22-Mar-2021 who presents for hospital follow-up after recent admission to [facility] on 7/8 through 7/9 for Takotsubo cardiomyopathy.  Prior to her admission in 03/22/21 she did not have any known cardiac disease.  Initially, she presented to an urgent care with right otalgia.  Prior to leaving her appointment she developed substernal chest pain without radiation or associated symptoms.  EKG showed inferior lateral ST elevation.  In this setting she was transferred to [facility] ED.  She underwent emergent LHC which showed moderate single-vessel CAD with 50 to 60% proximal LAD stenosis, severely reduced LVEF with mid and apical hypokinesis/akinesis and hyperdynamic basal contraction with findings consistent with stress-induced cardiomyopathy, moderately to severely elevated LVEDP of approximately 35 mmHg.  Medical management and diuresis was recommended.  Echo on 03/05/2021 showed an EF of 25 to 30%, akinesis of mid and apical walls with hyperdynamic basal function with findings consistent with Takotsubo stress-induced dilated cardiomyopathy, grade 1 diastolic dysfunction, normal RV systolic function and ventricular cavity size, a pericardial effusion surrounding the apex and anterior to the right ventricle was noted, trivial mitral regurgitation, mild aortic valve sclerosis without evidence of stenosis, and no evidence of mural apical thrombus, with or without Definity.  High-sensitivity troponin peaked at 4462.  She was  discharged on low-dose Toprol-XL and as needed Lasix.  Relative hypotension precluded escalation of further GDMT.  She comes in doing well from a cardiac perspective.  Since her hospital discharge she has gradually increased her physical activity with her functional status now being back to baseline.  She is ambulating one third of a mile per day around her neighborhood without issues.  She remains very active without cardiac limitation.  No chest pain, dyspnea, palpitations, presyncope, or syncope.  She does note some positional dizziness if she changes positions quickly.  No lower extremity swelling, abdominal distention, orthopnea, PND, early satiety.  She has not needed any as needed Lasix.  She is tolerating aspirin, atorvastatin, and Toprol-XL without issues.   Labs independently reviewed: Mar 22, 2021 - potassium 3.9, BUN 18, serum creatinine 0.6, Hgb 15.3, PLT 275, A1c 5.8, TC 234, TG 157, HDL 56, LDL 147, TSH normal, magnesium 2.7, albumin 3.7, AST/ALT normal  Past Medical History:  Diagnosis Date   CAD (coronary artery disease)    HFrEF (heart failure with reduced ejection fraction) ([place])    Hyperlipidemia LDL goal <70    Takotsubo cardiomyopathy    Thyroid disease     Past Surgical History:  Procedure Laterality Date   COLONOSCOPY  2013   cleared for 10 yrs- Dr @ [facility] CATH AND CORONARY ANGIOGRAPHY N/A 03/04/2021   Procedure: LEFT HEART CATH AND CORONARY ANGIOGRAPHY;  Surgeon: Nelva Bush, MD;  Location: [facility];  Service: Cardiovascular;  Laterality: N/A;    Current Medications: Current Meds  Medication Sig   aspirin EC 81 MG EC tablet Take 1 tablet (81 mg total) by mouth daily. Swallow whole.  atorvastatin (LIPITOR) 40 MG tablet Take 1 tablet (40 mg total) by mouth daily.   calcium citrate-vitamin D (CITRACAL+D) 315-200 MG-UNIT tablet Take 1 tablet by mouth daily.   furosemide (LASIX) 20 MG tablet Take 1 tablet (20 mg  total) by mouth daily as needed for edema (or shortness of breath).   metoprolol succinate (TOPROL-XL) 25 MG 24 hr tablet Take 0.5 tablets (12.5 mg total) by mouth at bedtime.   Multiple Vitamins-Minerals (CENTRUM SILVER 50+WOMEN PO) Take 1 tablet by mouth daily.   SYNTHROID 137 MCG tablet Take one-half tablet at bedtime.   vitamin E 180 MG (400 UNITS) capsule Take by mouth.    Allergies:   Patient has no known allergies.   Social History   Socioeconomic History   Marital status: Married    Spouse name: Not on file   Number of children: Not on file   Years of education: Not on file   Highest education level: Not on file  Occupational History   Not on file  Tobacco Use   Smoking status: Never   Smokeless tobacco: Never  Vaping Use   Vaping Use: Never used  Substance and Sexual Activity   Alcohol use: No    Alcohol/week: 0.0 standard drinks   Drug use: No   Sexual activity: Not Currently  Other Topics Concern   Not on file  Social History Narrative   Not on file   Social Determinants of Health   Financial Resource Strain: Not on file  Food Insecurity: Not on file  Transportation Needs: Not on file  Physical Activity: Not on file  Stress: Not on file  Social Connections: Not on file     Family History:  The patient's family history includes Heart disease in her father.  ROS:   Review of Systems  Constitutional:  Negative for chills, diaphoresis, fever, malaise/fatigue and weight loss.  HENT:  Negative for congestion.   Eyes:  Negative for discharge and redness.  Respiratory:  Negative for cough, sputum production, shortness of breath and wheezing.   Cardiovascular:  Negative for chest pain, palpitations, orthopnea, claudication, leg swelling and PND.  Gastrointestinal:  Negative for abdominal pain, heartburn, nausea and vomiting.  Musculoskeletal:  Negative for falls and myalgias.  Skin:  Negative for rash.  Neurological:  Negative for dizziness, tingling,  tremors, sensory change, speech change, focal weakness, loss of consciousness and weakness.  Endo/Heme/Allergies:  Does not bruise/bleed easily.  Psychiatric/Behavioral:  Negative for substance abuse. The patient is not nervous/anxious.   All other systems reviewed and are negative.   EKGs/Labs/Other Studies Reviewed:    Studies reviewed were summarized above. The additional studies were reviewed today:  LHC 03/04/2021: Conclusions: Moderate single-vessel coronary artery disease with 50 to 60% proximal LAD stenosis. Severely reduced LVEF with mid and apical hypokinesis/akinesis and hyperdynamic basilar contraction.  Findings are consistent with stress-induced cardiomyopathy. Moderately-severely elevated left ventricular filling pressure (LVEDP ~35 mmHg).   Recommendations: Admit to ICU for monitoring and medical optimization. Gentle diuresis. Add low-dose metoprolol as blood pressure tolerates. If blood pressure and renal function allow, consider addition of ACE inhibitor/ARB as soon as tomorrow. Aspirin and statin therapy to prevent progression of LAD disease. Obtain echocardiogram tomorrow with particular attention to the LV apex, as the patient is at risk for thrombus formation. __________  2D echo 03/05/2021: 1. Findings consistent with Takatsubo stress induced DCM. Akinesis of mid  and apical walls with hyperdyanamic basal function No mural apical  thrombus with or without definity .  Left ventricular ejection fraction, by  estimation, is 25 to 30%. The left  ventricle has severely decreased function. The left ventricle has no  regional wall motion abnormalities. Left ventricular diastolic parameters  are consistent with Grade I diastolic dysfunction (impaired relaxation).   2. Right ventricular systolic function is normal. The right ventricular  size is normal.   3. The pericardial effusion is surrounding the apex and anterior to the  right ventricle.   4. The mitral valve is  abnormal. Trivial mitral valve regurgitation. No  evidence of mitral stenosis.   5. The aortic valve is tricuspid. There is mild calcification of the  aortic valve. Aortic valve regurgitation is not visualized. Mild aortic  valve sclerosis is present, with no evidence of aortic valve stenosis.   6. The inferior vena cava is normal in size with greater than 50%  respiratory variability, suggesting right atrial pressure of 3 mmHg.   EKG:  EKG is ordered today.  The EKG ordered today demonstrates NSR, 87 bpm, first-degree AV block, left axis deviation, RBBB, anterolateral T wave inversion  Recent Labs: 03/04/2021: ALT 18; Magnesium 2.7; TSH 1.963 03/05/2021: BUN 18; Creatinine, Ser 0.60; Hemoglobin 15.3; Platelets 275; Potassium 3.9; Sodium 137  Recent Lipid Panel    Component Value Date/Time   CHOL 234 (H) 03/04/2021 1613   CHOL 227 (H) 03/10/2016 0827   TRIG 157 (H) 03/04/2021 1613   HDL 56 03/04/2021 1613   HDL 65 03/10/2016 0827   CHOLHDL 4.2 03/04/2021 1613   VLDL 31 03/04/2021 1613   LDLCALC 147 (H) 03/04/2021 1613   LDLCALC 144 (H) 03/10/2016 0827    PHYSICAL EXAM:    VS:  BP 130/60 (BP Location: Left Arm, Patient Position: Sitting, Cuff Size: Normal)   Pulse 87   Ht '5\' 2"'$  (1.575 m)   Wt 122 lb 6 oz (55.5 kg)   SpO2 98%   BMI 22.38 kg/m   BMI: Body mass index is 22.38 kg/m.  Physical Exam Vitals reviewed.  Constitutional:      Appearance: She is well-developed.  HENT:     Head: Normocephalic and atraumatic.  Eyes:     General:        Right eye: No discharge.        Left eye: No discharge.  Neck:     Vascular: No JVD.  Cardiovascular:     Rate and Rhythm: Normal rate and regular rhythm.     Pulses:          Posterior tibial pulses are 2+ on the right side and 2+ on the left side.     Heart sounds: Normal heart sounds, S1 normal and S2 normal. Heart sounds not distant. No midsystolic click and no opening snap. No murmur heard.   No friction rub.  Pulmonary:      Effort: Pulmonary effort is normal. No respiratory distress.     Breath sounds: Normal breath sounds. No decreased breath sounds, wheezing or rales.  Chest:     Chest wall: No tenderness.  Abdominal:     General: There is no distension.     Palpations: Abdomen is soft.     Tenderness: There is no abdominal tenderness.  Musculoskeletal:     Cervical back: Normal range of motion.     Right lower leg: No edema.     Left lower leg: No edema.  Skin:    General: Skin is warm and dry.     Nails: There is no clubbing.  Neurological:  Mental Status: She is alert and oriented to person, place, and time.  Psychiatric:        Speech: Speech normal.        Behavior: Behavior normal.        Thought Content: Thought content normal.        Judgment: Judgment normal.    Wt Readings from Last 3 Encounters:  04/06/21 122 lb 6 oz (55.5 kg)  03/04/21 109 lb 9.1 oz (49.7 kg)  03/04/21 119 lb (54 kg)     Orthostatic vital signs: Lying: 118/68, 86 bpm Sitting: 126/61, 86 bpm Standing: 107/64, 94 bpm Standing time 3 minutes: 127/71, 91 bpm  ASSESSMENT & PLAN:   Nonobstructive CAD without angina: She is doing well without symptoms concerning for angina and has increased her functional status level back to her prior baseline without cardiac limitation.  Continue ASA, Toprol-XL, and atorvastatin.  Aggressive risk factor modification.  No indication for further ischemic testing at this time.  HFrEF secondary to Takotsubo cardiomyopathy: She appears euvolemic and well compensated with NYHA class I symptoms.  Her functional status has improved back to prior baseline.  Continue Toprol-XL 12.5 mg daily and as needed Lasix.  Given noted positional dizziness, we deferred escalation of GDMT with ACEi/ARB/Entresto/MRA/SGLT2i at this time.  We will plan to update an echo in 2 to 4 weeks to reevaluate her LV systolic function.  CHF education was discussed.  HLD: LDL 147 in 02/2021 with goal being less than 70,  started on atorvastatin in 02/2021, which will be continued.  Recheck fasting lipid panel and LFT in 1 month.  Hyperthyroidism status post radioactive iodine therapy with subsequent hypothyroidism: She remains on replacement therapy.  Follow-up with PCP as directed.  This was not discussed in detail at today's visit.  Disposition: F/u with Dr. Saunders Revel or an APP in 4 to 6 weeks.   Medication Adjustments/Labs and Tests Ordered: Current medicines are reviewed at length with the patient today.  Concerns regarding medicines are outlined above. Medication changes, Labs and Tests ordered today are summarized above and listed in the Patient Instructions accessible in Encounters.   Signed, Christell Faith, PA-C 04/06/2021 3:18 PM     [address] [place], [place] [address] [phone]

## 2021-04-06 ENCOUNTER — Encounter: Payer: Self-pay | Admitting: Physician Assistant

## 2021-04-06 ENCOUNTER — Ambulatory Visit: Payer: PPO | Admitting: Physician Assistant

## 2021-04-06 ENCOUNTER — Other Ambulatory Visit: Payer: Self-pay

## 2021-04-06 VITALS — BP 130/60 | HR 87 | Ht 62.0 in | Wt 122.4 lb

## 2021-04-06 DIAGNOSIS — I2510 Atherosclerotic heart disease of native coronary artery without angina pectoris: Secondary | ICD-10-CM | POA: Diagnosis not present

## 2021-04-06 DIAGNOSIS — E785 Hyperlipidemia, unspecified: Secondary | ICD-10-CM | POA: Diagnosis not present

## 2021-04-06 DIAGNOSIS — I5181 Takotsubo syndrome: Secondary | ICD-10-CM

## 2021-04-06 NOTE — Patient Instructions (Signed)
Medication Instructions:  No changes at this time.  *If you need a refill on your cardiac medications before your next appointment, please call your pharmacy*   Lab Work: None  If you have labs (blood work) drawn today and your tests are completely normal, you will receive your results only by: [place] (if you have MyChart) OR A paper copy in the mail If you have any lab test that is abnormal or we need to change your treatment, we will call you to review the results.   Testing/Procedures: Your physician has requested that you have a limited echocardiogram in 2-4 weeks.   Echocardiography is a painless test that uses sound waves to create images of your heart. It provides your doctor with information about the size and shape of your heart and how well your heart's chambers and valves are working. This procedure takes approximately one hour. There are no restrictions for this procedure.    Follow-Up: At [facility], you and your health needs are our priority.  As part of our continuing mission to provide you with exceptional heart care, we have created designated Provider Care Teams.  These Care Teams include your primary Cardiologist (physician) and Advanced Practice Providers (APPs -  Physician Assistants and Nurse Practitioners) who all work together to provide you with the care you need, when you need it.  We recommend signing up for the patient portal called "MyChart".  Sign up information is provided on this After Visit Summary.  MyChart is used to connect with patients for Virtual Visits (Telemedicine).  Patients are able to view lab/test results, encounter notes, upcoming appointments, etc.  Non-urgent messages can be sent to your provider as well.   To learn more about what you can do with MyChart, go to [email or web address].    Your next appointment:   Follow up after your echocardiogram.  The format for your next appointment:   In Person  Provider:    Nelva Bush, MD or Christell Faith, PA-C

## 2021-04-20 ENCOUNTER — Other Ambulatory Visit: Payer: Self-pay

## 2021-04-20 ENCOUNTER — Ambulatory Visit (INDEPENDENT_AMBULATORY_CARE_PROVIDER_SITE_OTHER): Payer: PPO

## 2021-04-20 DIAGNOSIS — I5181 Takotsubo syndrome: Secondary | ICD-10-CM

## 2021-04-20 MED ORDER — PERFLUTREN LIPID MICROSPHERE
1.0000 mL | INTRAVENOUS | Status: AC | PRN
  Administered 2021-04-20: 2 mL via INTRAVENOUS

## 2021-04-21 LAB — ECHOCARDIOGRAM LIMITED
Area-P 1/2: 2.15 cm2
S' Lateral: 1.35 cm

## 2021-04-22 ENCOUNTER — Telehealth: Payer: Self-pay

## 2021-04-22 NOTE — Telephone Encounter (Signed)
Able to reach pt regarding her recent ECHO, Lori Faith, PA-C had a chance to review her results and advised   "Echo showed normal pump function, normal wall motion, and no significant valvular abnormalities.   When compared to her echo performed at the hospital, her pump function has improved and is now back to normal.  This is great news.  No changes in medical therapy.  Follow-up as planned next month. "  [name] very please with her resutls and phone call, will f/u next month as schedule, nothing further at this time.

## 2021-04-29 ENCOUNTER — Telehealth: Payer: Self-pay | Admitting: Physician Assistant

## 2021-04-29 NOTE — Telephone Encounter (Signed)
Spoke with patient and she reports worsening dizziness and feels like she cannot do anything. She went to grocery store and felt like she needed to sit down and had to leave without getting what she needed. She did not have any dizziness before starting this medication and once she started she had progressive dizziness. She is now scared to drive and she is normally active person. Inquired if she had any blood pressure readings and she does not monitor that. Instructed her to monitor blood pressures 2 hours after she takes that medication and then again in the morning and keep a log of those readings. Advised that I would forward this to provider for review and recommendations. She verbalized understanding of our conversation with no further questions at this time.

## 2021-04-29 NOTE — Telephone Encounter (Signed)
Pt c/o medication issue:  1. Name of Medication: Metoprolol  2. How are you currently taking this medication (dosage and times per day)? 25 mg 24hr tablet  3. Are you having a reaction (difficulty breathing--STAT)? no  4. What is your medication issue? Light headed dizzy all day

## 2021-04-29 NOTE — Telephone Encounter (Signed)
Reviewed provider recommendations with patient to hold medication and monitor her blood pressures. Instructed her on signs and symptoms which would require immediate evaluation in the emergency room. She reports that her daughter showed her how to use BP machine and it was 112/67. Requested she keep a log of her readings and to call if she should have any further questions or concerns. Confirmed upcoming appointment with her as well. She verbalized understanding of instructions with no further questions at this time.

## 2021-04-29 NOTE — Telephone Encounter (Signed)
Sounds like symptoms began after taking metoprolol. Per clinical staff discussion, no worrisome symptoms. EF now normal by echo on 04/20/2021. Ok to hold metoprolol. Monitor BP (patient did not have any for review). ED precautions.

## 2021-05-05 NOTE — Telephone Encounter (Signed)
STAT if patient feels like he/she is going to faint   Are you dizzy now? yes  Do you feel faint or have you passed out? Felt faint but no passing out  Do you have any other symptoms? no  Have you checked your HR and BP (record if available)? 138/72 Pulse 84

## 2021-05-05 NOTE — Telephone Encounter (Signed)
Spoke with the patient. She has continued to hold metoprolol as instructed but patient sts that she is still having intermittent dizziness and lightheadedness. She has not needed her prn lasix Pt sts that the dizziness is not associated with positional changes. She gets dizzy sitting or standing.  Pt sts that the dizziness occurs while siting and standing. She also reports feeling faint at times. She denies palpitations, pre-syncope or syncope. She is currently asymptomatic.  She has been monitoring her BP/HR several times a day. Please see patients VS below: 9/4 7am 115/66 83 bpm, 3:45 pm 95/65 81 bpm 9/5 6:45am 109/64 78 bpm, 6:45 pm 128/71 91 bpm 9/6 7:30am 140/78 81 bpm, 12pm 148/70 101 bpm, 5pm 122/74 91 bpm 9/7 7am 121/66 94 bpm, 11am 142/74 96 bpm (pt sts that she felt dizzy), 1pm 120/69 97 bpm 9/8 11:30am 135/77 94 bpm, 3:15 pm 138/72 84 bpm.  Adv the patient to change positions slowly. If dizzy while standing she should sit until it passes to avoid falls. She goes for walks in the morning. Adv her to stay well hydrated.  Patient was advised when to seek emergency care. Adv the patient that I will fwd the update to Christell Faith, PA for further recommendation.  Patient verbalized understanding and voiced appreciation for the call  back.

## 2021-05-06 ENCOUNTER — Emergency Department
Admission: EM | Admit: 2021-05-06 | Discharge: 2021-05-06 | Disposition: A | Discharge status: Home / Self Care | Payer: PPO | Attending: Emergency Medicine | Admitting: Emergency Medicine

## 2021-05-06 ENCOUNTER — Emergency Department: Payer: PPO

## 2021-05-06 ENCOUNTER — Other Ambulatory Visit: Payer: Self-pay

## 2021-05-06 DIAGNOSIS — Z7982 Long term (current) use of aspirin: Secondary | ICD-10-CM | POA: Diagnosis not present

## 2021-05-06 DIAGNOSIS — I5020 Unspecified systolic (congestive) heart failure: Secondary | ICD-10-CM | POA: Insufficient documentation

## 2021-05-06 DIAGNOSIS — Z79899 Other long term (current) drug therapy: Secondary | ICD-10-CM | POA: Diagnosis not present

## 2021-05-06 DIAGNOSIS — E039 Hypothyroidism, unspecified: Secondary | ICD-10-CM | POA: Insufficient documentation

## 2021-05-06 DIAGNOSIS — I2510 Atherosclerotic heart disease of native coronary artery without angina pectoris: Secondary | ICD-10-CM | POA: Insufficient documentation

## 2021-05-06 DIAGNOSIS — R42 Dizziness and giddiness: Secondary | ICD-10-CM | POA: Diagnosis not present

## 2021-05-06 LAB — TROPONIN I (HIGH SENSITIVITY): Troponin I (High Sensitivity): 11 ng/L (ref <18)

## 2021-05-06 LAB — URINALYSIS, COMPLETE (UACMP) WITH MICROSCOPIC
Bacteria, UA: NONE SEEN
Bilirubin Urine: NEGATIVE
Glucose, UA: NEGATIVE mg/dL
Ketones, ur: NEGATIVE mg/dL
Leukocytes,Ua: NEGATIVE
Nitrite: NEGATIVE
Protein, ur: NEGATIVE mg/dL
Specific Gravity, Urine: 1.01 (ref 1.005–1.030)
WBC, UA: NONE SEEN WBC/hpf (ref 0–5)
pH: 7 (ref 5.0–8.0)

## 2021-05-06 LAB — CBC
HCT: 45.5 % (ref 36.0–46.0)
Hemoglobin: 15.7 g/dL — ABNORMAL HIGH (ref 12.0–15.0)
MCH: 31 pg (ref 26.0–34.0)
MCHC: 34.5 g/dL (ref 30.0–36.0)
MCV: 89.7 fL (ref 80.0–100.0)
Platelets: 270 10*3/uL (ref 150–400)
RBC: 5.07 MIL/uL (ref 3.87–5.11)
RDW: 13.6 % (ref 11.5–15.5)
WBC: 6.3 10*3/uL (ref 4.0–10.5)
nRBC: 0 % (ref 0.0–0.2)

## 2021-05-06 LAB — BASIC METABOLIC PANEL
Anion gap: 5 (ref 5–15)
BUN: 23 mg/dL (ref 8–23)
CO2: 29 mmol/L (ref 22–32)
Calcium: 9.1 mg/dL (ref 8.9–10.3)
Chloride: 103 mmol/L (ref 98–111)
Creatinine, Ser: 0.68 mg/dL (ref 0.44–1.00)
GFR, Estimated: >60 mL/min (ref >60)
Glucose, Bld: 111 mg/dL — ABNORMAL HIGH (ref 70–99)
Potassium: 4.1 mmol/L (ref 3.5–5.1)
Sodium: 137 mmol/L (ref 135–145)

## 2021-05-06 MED ORDER — MECLIZINE HCL 25 MG PO TABS: 25.0000 mg | ORAL_TABLET | Freq: Three times a day (TID) | ORAL | 0 refills | Status: DC | PRN

## 2021-05-06 MED ORDER — MECLIZINE HCL 25 MG PO TABS
25.0000 mg | ORAL_TABLET | Freq: Once | ORAL | Status: AC
  Administered 2021-05-06: 25 mg via ORAL
  Filled 2021-05-06: qty 1

## 2021-05-06 NOTE — Addendum Note (Signed)
Addended by: Kavin Leech on: 05/06/2021 11:27 AM   Modules accepted: Orders

## 2021-05-06 NOTE — ED Triage Notes (Signed)
Pt comes with c/o dizziness. Pt states it has been going on for 2 months. Family reports it is now extreme.  Pt denies any N/V/D. Pt denies any falls or injuries.

## 2021-05-06 NOTE — Telephone Encounter (Signed)
With continued intermittent dizziness, despite holding metoprolol, please schedule her for a head CT without contrast. When I see her in follow up next week, we will plan for a Zio patch as well. Continue to hold metoprolol. ED precautions.

## 2021-05-06 NOTE — Addendum Note (Signed)
Addended by: Kavin Leech on: 05/06/2021 10:53 AM   Modules accepted: Orders

## 2021-05-06 NOTE — ED Provider Notes (Signed)
[facility] Emergency Department Provider Note  ____________________________________________   Event Date/Time   First MD Initiated Contact with Patient 05/06/21 1242     (approximate)  I have reviewed the triage vital signs and the nursing notes.   HISTORY  Chief Complaint Dizziness   HPI Lori Rosario is a 80 y.o. female no past medical history of CAD, CHF, HDL, Takotsubo cardiomyopathy, hypothyroidism who presents coming by her daughter for assessment of some dizziness.  Patient states this been going on for about 2 months and will last a couple of minutes.  It is not clearly precipitated by movement although sometimes will happen after she stands up or moves her head and often while she is walking.  She states it was particularly severe today and more frequent today.  She states difficulty walking today which is unusual to her.  She denies any vision changes, chest pain, cough, shortness of breath, nausea, vomiting, diarrhea, dysuria, rash or other associated sick symptoms.  No other acute concerns at this time.         Past Medical History:  Diagnosis Date   CAD (coronary artery disease)    HFrEF (heart failure with reduced ejection fraction) (HCC)    Hyperlipidemia LDL goal <70    Takotsubo cardiomyopathy    Thyroid disease     Patient Active Problem List   Diagnosis Date Noted   Takotsubo cardiomyopathy 03/04/2021   NSTEMI (non-ST elevated myocardial infarction) ([place]) 03/04/2021   ST elevation myocardial infarction (STEMI) ([place])    Hyperlipidemia 03/04/2015   Dizziness 02/10/2014   Hypothyroidism 02/19/2012    Past Surgical History:  Procedure Laterality Date   COLONOSCOPY  2013   cleared for 10 yrs- Dr @ [place]     LEFT HEART CATH AND CORONARY ANGIOGRAPHY N/A 03/04/2021   Procedure: LEFT HEART CATH AND CORONARY ANGIOGRAPHY;  Surgeon: Nelva Bush, MD;  Location: [facility];  Service: Cardiovascular;   Laterality: N/A;    Prior to Admission medications   Medication Sig Start Date End Date Taking? Authorizing Provider  meclizine (ANTIVERT) 25 MG tablet Take 1 tablet (25 mg total) by mouth 3 (three) times daily as needed for dizziness. 05/06/21  Yes Lucrezia Starch, MD  aspirin EC 81 MG EC tablet Take 1 tablet (81 mg total) by mouth daily. Swallow whole. 03/06/21   Loletha Grayer, MD  atorvastatin (LIPITOR) 40 MG tablet Take 1 tablet (40 mg total) by mouth daily. 03/06/21   Loletha Grayer, MD  calcium citrate-vitamin D (CITRACAL+D) 315-200 MG-UNIT tablet Take 1 tablet by mouth daily.    [provider]  furosemide (LASIX) 20 MG tablet Take 1 tablet (20 mg total) by mouth daily as needed for edema (or shortness of breath). 03/05/21   Loletha Grayer, MD  metoprolol succinate (TOPROL-XL) 25 MG 24 hr tablet Take 0.5 tablets (12.5 mg total) by mouth at bedtime. 03/05/21   Loletha Grayer, MD  Multiple Vitamins-Minerals (CENTRUM SILVER 50+WOMEN PO) Take 1 tablet by mouth daily.    [provider]  SYNTHROID 137 MCG tablet Take one-half tablet at bedtime. 02/22/21   [provider]  vitamin E 180 MG (400 UNITS) capsule Take by mouth.    [provider]    Allergies Patient has no known allergies.  Family History  Problem Relation Age of Onset   Heart disease Father     Social History Social History   Tobacco Use   Smoking status:  Never   Smokeless tobacco: Never  Vaping Use   Vaping Use: Never used  Substance Use Topics   Alcohol use: No    Alcohol/week: 0.0 standard drinks   Drug use: No    Review of Systems  Review of Systems  Constitutional:  Negative for chills and fever.  HENT:  Negative for sore throat.   Eyes:  Negative for pain.  Respiratory:  Negative for cough and stridor.   Cardiovascular:  Negative for chest pain.  Gastrointestinal:  Negative for vomiting.  Genitourinary:  Negative for dysuria.  Musculoskeletal:  Negative for  myalgias.  Skin:  Negative for rash.  Neurological:  Positive for dizziness. Negative for seizures, loss of consciousness and headaches.  Psychiatric/Behavioral:  Negative for suicidal ideas.   All other systems reviewed and are negative.    ____________________________________________   PHYSICAL EXAM:  VITAL SIGNS: ED Triage Vitals [05/06/21 1217]  Enc Vitals Group     BP      Pulse      Resp      Temp      Temp src      SpO2      Weight      Height      Head Circumference      Peak Flow      Pain Score 0     Pain Loc      Pain Edu?      Excl. in [place]?    Vitals:   05/06/21 1241 05/06/21 1330  BP: (!) 144/62 (!) 135/58  Pulse: 92 89  Resp: 16 17  Temp: 98.7 F (37.1 C)   SpO2: 97% 99%   Physical Exam Vitals and nursing note reviewed.  Constitutional:      General: She is not in acute distress.    Appearance: She is well-developed.  HENT:     Head: Normocephalic and atraumatic.     Right Ear: Tympanic membrane and external ear normal.     Left Ear: Tympanic membrane and external ear normal.     Nose: Nose normal.  Eyes:     Conjunctiva/sclera: Conjunctivae normal.  Cardiovascular:     Rate and Rhythm: Normal rate and regular rhythm.     Heart sounds: No murmur heard. Pulmonary:     Effort: Pulmonary effort is normal. No respiratory distress.     Breath sounds: Normal breath sounds.  Abdominal:     Palpations: Abdomen is soft.     Tenderness: There is no abdominal tenderness. There is no right CVA tenderness or left CVA tenderness.  Musculoskeletal:     Cervical back: Neck supple.  Skin:    General: Skin is warm and dry.     Capillary Refill: Capillary refill takes less than 2 seconds.  Neurological:     Mental Status: She is alert and oriented to person, place, and time.  Psychiatric:        Mood and Affect: Mood normal.    Cranial nerves II through XII grossly intact.  No pronator drift.  No finger dysmetria.  Symmetric 5/5 strength of all  extremities.  Sensation intact to light touch in all extremities.  Unremarkable unassisted gait.  ____________________________________________   LABS (all labs ordered are listed, but only abnormal results are displayed)  Labs Reviewed  BASIC METABOLIC PANEL - Abnormal; Notable for the following components:      Result Value   Glucose, Bld 111 (*)    All other components within normal limits  CBC -  Abnormal; Notable for the following components:   Hemoglobin 15.7 (*)    All other components within normal limits  URINALYSIS, COMPLETE (UACMP) WITH MICROSCOPIC - Abnormal; Notable for the following components:   Hgb urine dipstick TRACE (*)    All other components within normal limits  CBG MONITORING, ED  TROPONIN I (HIGH SENSITIVITY)   ____________________________________________  EKG  NSR with RBBB, 1st degree block, rate 93, and some non-specific changes in anterior leads.  ____________________________________________  RADIOLOGY  ED MD interpretation: MRI shows no evidence of CVA but does show some mild to moderate chronic microvascular ischemic changes.  Official radiology report(s): MR BRAIN WO CONTRAST  Result Date: 05/06/2021 CLINICAL DATA:  Dizziness, non-specific EXAM: MRI HEAD WITHOUT CONTRAST TECHNIQUE: Multiplanar, multiecho pulse sequences of the brain and surrounding structures were obtained without intravenous contrast. COMPARISON:  None. FINDINGS: Brain: No acute infarction, hemorrhage, hydrocephalus, extra-axial collection or mass lesion. Mild-to-moderate scattered T2 hyperintensities within the white matter, nonspecific but compatible with chronic microvascular ischemic disease. Vascular: Major arterial flow voids are maintained at the skull base. Skull and upper cervical spine: Normal marrow signal. Sinuses/Orbits: Negative. Other: No mastoid effusions. IMPRESSION: 1. No evidence of acute intracranial abnormality. 2. Mild-to-moderate chronic microvascular ischemic  disease. Electronically Signed   By: Margaretha Sheffield M.D.   On: 05/06/2021 14:48    ____________________________________________   PROCEDURES  Procedure(s) performed (including Critical Care):  Procedures   ____________________________________________   INITIAL IMPRESSION / ASSESSMENT AND PLAN / ED COURSE        Patient presents with above-stated history exam for assessment of some acute on subacute dizziness with more frequent episodes today that were so severe she had some difficulty walking.  No other associated sick symptoms.  She is afebrile and hemodynamically stable on arrival.  She has a nonfocal neurological exam.  Differential with possible peripheral versus central cause of vertigo.  She does describe spinning sensation more suspicious for vertigo than presyncopal symptoms although this is also within differential.  ECG shows no significant arrhythmia and nonelevated troponin absence of any history of chest pain is not suggestive of ACS or arrhythmia causing patient's symptoms.  CBC shows no leukocytosis or acute anemia.  BMP shows no significant electrode metabolic derangements.  UA has no evidence of infection.  Patient given dose of meclizine on reassessment stated she was feeling much better and had not had any recurrence of episodes over almost 2 hours which was but she has been feeling all day.  Given stable vitals with eyes reassuring exam and work-up including no evidence on MRI of CVA the patient stating she is feeling much better I think she is stable for discharge with close outpatient ENT follow-up with concerns for peripheral etiology for vertigo.  Given good response to Antivert will write Rx for this.  Discharged stable condition.  Strict return precautions advised and discussed.        ____________________________________________   FINAL CLINICAL IMPRESSION(S) / ED DIAGNOSES  Final diagnoses:  Dizziness    Medications  meclizine (ANTIVERT)  tablet 25 mg (25 mg Oral Given 05/06/21 1321)     ED Discharge Orders          Ordered    meclizine (ANTIVERT) 25 MG tablet  3 times daily PRN        05/06/21 1511             Note:  This document was prepared using Dragon voice recognition software and may include unintentional dictation errors.  Lucrezia Starch, MD 05/06/21 [phone]

## 2021-05-06 NOTE — Telephone Encounter (Signed)
Patient daughter calling to discuss continued dizziness and is aware someone will reach out with provider recommendations.

## 2021-05-06 NOTE — Telephone Encounter (Signed)
Called patients daughter back and she informed me that the patient is now in a constant state of dizziness. I sent a secure chat to Christell Faith for an update and he recommended that instead of ordering the head CT, to recommend that the patient go to the ER to get imaging and lab work.   The daughter was very agreeable and stated they would go now.

## 2021-05-08 NOTE — Progress Notes (Signed)
Cardiology Office Note    Date:  05/13/2021   ID:  Lori Rosario, Lori Rosario 12/13/1940, MRN [id number]  PCP:  Lori Hartigan, MD  Cardiologist:  Lori Bush, MD  Electrophysiologist:  None   Chief Complaint: Follow-up  History of Present Illness:   Lori Rosario is a 80 y.o. female with history of CAD, HFrEF secondary to stress-induced cardiomyopathy with normalization of LV systolic function by echo in 03/2021, hyperthyroidism status post radioactive iodine therapy in her 20s now on replacement therapy, and HLD who's husband passed away in early 2021-04-08 who presents for follow-up of echo.   Prior to her admission in 08-Apr-2021 she did not have any known cardiac disease.  Initially, she presented to an urgent care with right otalgia.  Prior to leaving her appointment she developed substernal chest pain without radiation or associated symptoms.  EKG showed inferior lateral ST elevation.  In this setting she was transferred to [facility] ED.  She underwent emergent LHC which showed moderate single-vessel CAD with 50 to 60% proximal LAD stenosis, severely reduced LVEF with mid and apical hypokinesis/akinesis and hyperdynamic basal contraction with findings consistent with stress-induced cardiomyopathy, moderately to severely elevated LVEDP of approximately 35 mmHg.  Medical management and diuresis was recommended.  Echo on 03/05/2021 showed an EF of 25 to 30%, akinesis of mid and apical walls with hyperdynamic basal function with findings consistent with Takotsubo stress-induced dilated cardiomyopathy, grade 1 diastolic dysfunction, normal RV systolic function and ventricular cavity size, a pericardial effusion surrounding the apex and anterior to the right ventricle was noted, trivial mitral regurgitation, mild aortic valve sclerosis without evidence of stenosis, and no evidence of mural apical thrombus, with or without Definity.  High-sensitivity troponin peaked at 4462.  She was discharged on low-dose Toprol-XL  and as needed Lasix.  Relative hypotension precluded escalation of further GDMT.  She was seen in hospital follow-up on 04/06/2021 and was doing very well from a cardiac perspective.  She was ambulating one third of a mile per day without issues.  She was tolerating medications.  Repeat limited echo on 04/20/2021 showed normalization of LV systolic function with an EF of 55 to 60%, no regional wall motion abnormalities, normal RV systolic function and ventricular cavity size, and no significant valvular abnormalities.  Since she was last seen, metoprolol was discontinued as it was felt this may be contributing to onset of dizziness.  She was evaluated in the ED on 05/06/2021 with persistent dizziness.  Laboratory evaluation was unrevealing including high-sensitivity troponin.  MRI brain was nonacute.  EKG demonstrated sinus rhythm with known first-degree AV block.  Treated with a dose of meclizine with improvement in symptoms.  She was advised to follow-up with ENT as an outpatient.  She was evaluated by ENT earlier this week with symptoms consistent with vertigo.  She comes in accompanied by one of her daughters today.  She has continued to note intermittent dizziness described as a room spinning sensation that comes and goes in waves that will last for several minutes in duration.  She notes meclizine has led to the resolution of episodes, in particular a very bad episode of waxing and waning dizziness earlier this week.  She does feel like her heart is pounding during these episodes.  No frank angina, or dyspnea.  No syncope.  No lower extremity swelling or abdominal distention.  With her noted dizziness, she is not currently undergoing her regular walking regimen, though would like to resume this when able.  She has follow-up with ENT next week for VNG.  No orthopnea or PND.  Weight remains stable.  BP log shows readings ranging from 95-148/59-78 with heart rates ranging from 56 to 101 bpm.  For the most  part, BP has ranged in the 120s to 130s with most heart rates in the 80s to 90s bpm.   Labs independently reviewed: 04/2021 - Hgb 15.7, PLT 270, potassium 4.1, BUN 23, serum creatinine 0.68 02/2021 - A1c 5.8, TC 234, TG 157, HDL 56, LDL 147, TSH normal, magnesium 2.7, albumin 3.7, AST/ALT normal  Past Medical History:  Diagnosis Date   CAD (coronary artery disease)    HFrEF (heart failure with reduced ejection fraction) ([place])    Hyperlipidemia LDL goal <70    Takotsubo cardiomyopathy    Thyroid disease     Past Surgical History:  Procedure Laterality Date   COLONOSCOPY  2013   cleared for 10 yrs- Dr @ [place] CATH AND CORONARY ANGIOGRAPHY N/A 03/04/2021   Procedure: LEFT HEART CATH AND CORONARY ANGIOGRAPHY;  Surgeon: Lori Bush, MD;  Location: [place] [facility];  Service: Cardiovascular;  Laterality: N/A;    Current Medications: Current Meds  Medication Sig   aspirin EC 81 MG EC tablet Take 1 tablet (81 mg total) by mouth daily. Swallow whole.   atorvastatin (LIPITOR) 40 MG tablet Take 1 tablet (40 mg total) by mouth daily.   calcium citrate-vitamin D (CITRACAL+D) 315-200 MG-UNIT tablet Take 1 tablet by mouth daily.   furosemide (LASIX) 20 MG tablet Take 1 tablet (20 mg total) by mouth daily as needed for edema (or shortness of breath).   meclizine (ANTIVERT) 25 MG tablet Take 1 tablet (25 mg total) by mouth 3 (three) times daily as needed for dizziness.   Multiple Vitamins-Minerals (CENTRUM SILVER 50+WOMEN PO) Take 1 tablet by mouth daily.   SYNTHROID 137 MCG tablet Take one-half tablet at bedtime.   vitamin E 180 MG (400 UNITS) capsule Take by mouth daily.    Allergies:   Patient has no known allergies.   Social History   Socioeconomic History   Marital status: Married    Spouse name: Not on file   Number of children: Not on file   Years of education: Not on file   Highest education level: Not on file  Occupational History    Not on file  Tobacco Use   Smoking status: Never   Smokeless tobacco: Never  Vaping Use   Vaping Use: Never used  Substance and Sexual Activity   Alcohol use: No    Alcohol/week: 0.0 standard drinks   Drug use: No   Sexual activity: Not Currently  Other Topics Concern   Not on file  Social History Narrative   Not on file   Social Determinants of Health   Financial Resource Strain: Not on file  Food Insecurity: Not on file  Transportation Needs: Not on file  Physical Activity: Not on file  Stress: Not on file  Social Connections: Not on file     Family History:  The patient's family history includes Heart disease in her father.  ROS:   Review of Systems  Constitutional:  Negative for chills, diaphoresis, fever, malaise/fatigue and weight loss.  HENT:  Negative for congestion.   Eyes:  Negative for discharge and redness.  Respiratory:  Negative for cough, sputum production, shortness of breath and wheezing.   Cardiovascular:  Negative for chest pain, palpitations,  orthopnea, claudication, leg swelling and PND.  Musculoskeletal:  Negative for falls and myalgias.  Skin:  Negative for rash.  Neurological:  Positive for dizziness. Negative for tingling, tremors, sensory change, speech change, focal weakness, loss of consciousness and weakness.       Room spinning sensation  Endo/Heme/Allergies:  Does not bruise/bleed easily.  Psychiatric/Behavioral:  Negative for substance abuse. The patient is not nervous/anxious.     EKGs/Labs/Other Studies Reviewed:    Studies reviewed were summarized above. The additional studies were reviewed today:  LHC 03/04/2021: Conclusions: Moderate single-vessel coronary artery disease with 50 to 60% proximal LAD stenosis. Severely reduced LVEF with mid and apical hypokinesis/akinesis and hyperdynamic basilar contraction.  Findings are consistent with stress-induced cardiomyopathy. Moderately-severely elevated left ventricular filling pressure  (LVEDP ~35 mmHg).   Recommendations: Admit to ICU for monitoring and medical optimization. Gentle diuresis. Add low-dose metoprolol as blood pressure tolerates. If blood pressure and renal function allow, consider addition of ACE inhibitor/ARB as soon as tomorrow. Aspirin and statin therapy to prevent progression of LAD disease. Obtain echocardiogram tomorrow with particular attention to the LV apex, as the patient is at risk for thrombus formation. __________   2D echo 03/05/2021: 1. Findings consistent with Takatsubo stress induced DCM. Akinesis of mid  and apical walls with hyperdyanamic basal function No mural apical  thrombus with or without definity . Left ventricular ejection fraction, by  estimation, is 25 to 30%. The left  ventricle has severely decreased function. The left ventricle has no  regional wall motion abnormalities. Left ventricular diastolic parameters  are consistent with Grade I diastolic dysfunction (impaired relaxation).   2. Right ventricular systolic function is normal. The right ventricular  size is normal.   3. The pericardial effusion is surrounding the apex and anterior to the  right ventricle.   4. The mitral valve is abnormal. Trivial mitral valve regurgitation. No  evidence of mitral stenosis.   5. The aortic valve is tricuspid. There is mild calcification of the  aortic valve. Aortic valve regurgitation is not visualized. Mild aortic  valve sclerosis is present, with no evidence of aortic valve stenosis.   6. The inferior vena cava is normal in size with greater than 50%  respiratory variability, suggesting right atrial pressure of 3 mmHg. __________  Limited echo 04/20/2021: 1. Left ventricular ejection fraction, by estimation, is 55 to 60%. The  left ventricle has normal function. The left ventricle has no regional  wall motion abnormalities.   2. Right ventricular systolic function is normal. The right ventricular  size is normal.   3. The  aortic valve was not well visualized. Aortic valve regurgitation  is not visualized.  __________  EKG:  EKG is not ordered today.    Recent Labs: 03/04/2021: ALT 18; Magnesium 2.7; TSH 1.963 05/06/2021: BUN 23; Creatinine, Ser 0.68; Hemoglobin 15.7; Platelets 270; Potassium 4.1; Sodium 137  Recent Lipid Panel    Component Value Date/Time   CHOL 234 (H) 03/04/2021 1613   CHOL 227 (H) 03/10/2016 0827   TRIG 157 (H) 03/04/2021 1613   HDL 56 03/04/2021 1613   HDL 65 03/10/2016 0827   CHOLHDL 4.2 03/04/2021 1613   VLDL 31 03/04/2021 1613   LDLCALC 147 (H) 03/04/2021 1613   LDLCALC 144 (H) 03/10/2016 0827    PHYSICAL EXAM:    VS:  BP 130/60 (BP Location: Left Arm, Patient Position: Sitting, Cuff Size: Normal)   Pulse 90   Ht '5\' 2"'$  (1.575 m)   Wt 120  lb (54.4 kg)   SpO2 97%   BMI 21.95 kg/m   BMI: Body mass index is 21.95 kg/m.  Physical Exam Vitals reviewed.  Constitutional:      Appearance: She is well-developed.  HENT:     Head: Normocephalic and atraumatic.  Eyes:     General:        Right eye: No discharge.        Left eye: No discharge.  Neck:     Vascular: No JVD.  Cardiovascular:     Rate and Rhythm: Normal rate and regular rhythm.     Pulses:          Posterior tibial pulses are 2+ on the right side and 2+ on the left side.     Heart sounds: Normal heart sounds, S1 normal and S2 normal. Heart sounds not distant. No midsystolic click and no opening snap. No murmur heard.   No friction rub.  Pulmonary:     Effort: Pulmonary effort is normal. No respiratory distress.     Breath sounds: Normal breath sounds. No decreased breath sounds, wheezing or rales.  Chest:     Chest wall: No tenderness.  Abdominal:     General: There is no distension.     Palpations: Abdomen is soft.     Tenderness: There is no abdominal tenderness.  Musculoskeletal:     Cervical back: Normal range of motion.     Right lower leg: No edema.     Left lower leg: No edema.  Skin:     General: Skin is warm and dry.     Nails: There is no clubbing.  Neurological:     Mental Status: She is alert and oriented to person, place, and time.  Psychiatric:        Speech: Speech normal.        Behavior: Behavior normal.        Thought Content: Thought content normal.        Judgment: Judgment normal.    Wt Readings from Last 3 Encounters:  05/13/21 120 lb (54.4 kg)  04/06/21 122 lb 6 oz (55.5 kg)  03/04/21 109 lb 9.1 oz (49.7 kg)     ASSESSMENT & PLAN:   Nonobstructive CAD without angina: She is doing well without any symptoms concerning for angina.  Her functional status has been limited secondary to dizziness.  She remains on ASA and atorvastatin.  No longer on Toprol-XL given normalization of LV systolic function and in the context of underlying dizziness.  Aggressive risk factor modification recommended.  No indication for further ischemic testing at this time.  HFrEF secondary to Takotsubo cardiomyopathy with subsequent normalization of LV systolic function: She appears euvolemic and well compensated.  Repeat echo did demonstrate normalization of her EF.  Toprol-XL has been discontinued secondary to normalization of LV systolic function, though largely in the context of recent finding of dizziness.  For now, rechallenge of this medication will be deferred.  She remains on as needed furosemide, though I suspect she will not need this.  Given normalization of LV systolic function and dizziness, we will defer addition of ACEi/ARB/ARNI/MRA/SGLT2i.  Dizziness: Initially felt to be related to metoprolol.  However, symptoms persisted following discontinuation of metoprolol.  Concerning for vertigo, with symptoms improving with meclizine.  Now followed by ENT.  She was evaluated in the ED on 9/9 with work-up being unrevealing including MRI brain and laboratory analysis.  With noted palpitations, place Zio patch.  HLD: LDL 147 in  02/2021 with goal being less than 70, started on  atorvastatin in 02/2021, which she is tolerating.  We will update a fasting lipid panel and LFT when she is seen in follow-up.  Hyperthyroidism status post radioactive iodine therapy with subsequent hypothyroidism: She remains on replacement therapy.  Follow-up with PCP as directed.  This was not discussed in detail at today's visit.  Disposition: F/u with Dr. Saunders Revel or an APP in 6 weeks.   Medication Adjustments/Labs and Tests Ordered: Current medicines are reviewed at length with the patient today.  Concerns regarding medicines are outlined above. Medication changes, Labs and Tests ordered today are summarized above and listed in the Patient Instructions accessible in Encounters.   Signed, Christell Faith, PA-C 05/13/2021 12:55 PM     [address] [place], [place] [address] [phone]

## 2021-05-10 DIAGNOSIS — R42 Dizziness and giddiness: Secondary | ICD-10-CM | POA: Diagnosis not present

## 2021-05-13 ENCOUNTER — Ambulatory Visit: Payer: PPO | Admitting: Physician Assistant

## 2021-05-13 ENCOUNTER — Ambulatory Visit (INDEPENDENT_AMBULATORY_CARE_PROVIDER_SITE_OTHER): Payer: PPO

## 2021-05-13 ENCOUNTER — Encounter: Payer: Self-pay | Admitting: Physician Assistant

## 2021-05-13 ENCOUNTER — Other Ambulatory Visit: Payer: Self-pay

## 2021-05-13 VITALS — BP 130/60 | HR 90 | Ht 62.0 in | Wt 120.0 lb

## 2021-05-13 DIAGNOSIS — R42 Dizziness and giddiness: Secondary | ICD-10-CM

## 2021-05-13 DIAGNOSIS — I5181 Takotsubo syndrome: Secondary | ICD-10-CM | POA: Diagnosis not present

## 2021-05-13 DIAGNOSIS — I2510 Atherosclerotic heart disease of native coronary artery without angina pectoris: Secondary | ICD-10-CM | POA: Diagnosis not present

## 2021-05-13 DIAGNOSIS — R002 Palpitations: Secondary | ICD-10-CM

## 2021-05-13 DIAGNOSIS — E785 Hyperlipidemia, unspecified: Secondary | ICD-10-CM | POA: Diagnosis not present

## 2021-05-13 NOTE — Patient Instructions (Signed)
Medication Instructions:   Your physician recommends that you continue on your current medications as directed. Please refer to the Current Medication list given to you today.  *If you need a refill on your cardiac medications before your next appointment, please call your pharmacy*   Lab Work:  None ordered   Testing/Procedures:  Your physician has recommended that you wear a Zio XT monitor for TWO WEEKS.   This monitor is a medical device that records the heart's electrical activity. Doctors most often use these monitors to diagnose arrhythmias. Arrhythmias are problems with the speed or rhythm of the heartbeat. The monitor is a small device applied to your chest. You can wear one while you do your normal daily activities. While wearing this monitor if you have any symptoms to push the button and record what you felt. Once you have worn this monitor for the period of time provider prescribed (Usually 14 days), you will return the monitor device in the postage paid box. Once it is returned they will download the data collected and provide [place] with a report which the provider will then review and we will call you with those results. Important tips:  Avoid showering during the first 24 hours of wearing the monitor. Avoid excessive sweating to help maximize wear time. Do not submerge the device, no hot tubs, and no swimming pools. Keep any lotions or oils away from the patch. After 24 hours you may shower with the patch on. Take brief showers with your back facing the shower head.  Do not remove patch once it has been placed because that will interrupt data and decrease adhesive wear time. Push the button when you have any symptoms and write down what you were feeling. Once you have completed wearing your monitor, remove and place into box which has postage paid and place in your outgoing mailbox.  If for some reason you have misplaced your box then call our office and we can provide another  box and/or mail it off for you.      Follow-Up: At [facility], you and your health needs are our priority.  As part of our continuing mission to provide you with exceptional heart care, we have created designated Provider Care Teams.  These Care Teams include your primary Cardiologist (physician) and Advanced Practice Providers (APPs -  Physician Assistants and Nurse Practitioners) who all work together to provide you with the care you need, when you need it.  We recommend signing up for the patient portal called "MyChart".  Sign up information is provided on this After Visit Summary.  MyChart is used to connect with patients for Virtual Visits (Telemedicine).  Patients are able to view lab/test results, encounter notes, upcoming appointments, etc.  Non-urgent messages can be sent to your provider as well.   To learn more about what you can do with MyChart, go to [email or web address].    Your next appointment:   6 week(s)  The format for your next appointment:   In Person  Provider:   Christell Faith, PA-C

## 2021-05-18 DIAGNOSIS — H903 Sensorineural hearing loss, bilateral: Secondary | ICD-10-CM | POA: Diagnosis not present

## 2021-05-18 DIAGNOSIS — R42 Dizziness and giddiness: Secondary | ICD-10-CM | POA: Diagnosis not present

## 2021-05-24 DIAGNOSIS — R262 Difficulty in walking, not elsewhere classified: Secondary | ICD-10-CM | POA: Diagnosis not present

## 2021-05-24 DIAGNOSIS — R42 Dizziness and giddiness: Secondary | ICD-10-CM | POA: Diagnosis not present

## 2021-05-24 DIAGNOSIS — R278 Other lack of coordination: Secondary | ICD-10-CM | POA: Diagnosis not present

## 2021-05-26 ENCOUNTER — Telehealth: Payer: Self-pay | Admitting: Physician Assistant

## 2021-05-26 DIAGNOSIS — G253 Myoclonus: Secondary | ICD-10-CM | POA: Diagnosis not present

## 2021-05-26 DIAGNOSIS — R42 Dizziness and giddiness: Secondary | ICD-10-CM | POA: Diagnosis not present

## 2021-05-26 NOTE — Telephone Encounter (Signed)
Pt c/o medication issue:  1. Name of Medication: atorvastatin   2. How are you currently taking this medication (dosage and times per day)? Advised to hold for 3 weeks   3. Are you having a reaction (difficulty breathing--STAT)? Vertigo   4. What is your medication issue? Dr. Melrose Nakayama advised patient to hold for 3 weeks due to vertigo and she wants  to make sure ok with [name]

## 2021-05-26 NOTE — Telephone Encounter (Signed)
Patient gave verbal consent to speak with daughter at this time. She wanted to let us know about holding the medication per Dr. Melrose Nakayama. Advised to let us know if they have further recommendations. She verbalized understanding with no further questions at this time.

## 2021-05-27 DIAGNOSIS — R278 Other lack of coordination: Secondary | ICD-10-CM | POA: Diagnosis not present

## 2021-05-27 DIAGNOSIS — R262 Difficulty in walking, not elsewhere classified: Secondary | ICD-10-CM | POA: Diagnosis not present

## 2021-05-27 DIAGNOSIS — R42 Dizziness and giddiness: Secondary | ICD-10-CM | POA: Diagnosis not present

## 2021-05-28 DIAGNOSIS — I442 Atrioventricular block, complete: Secondary | ICD-10-CM

## 2021-05-28 HISTORY — DX: Atrioventricular block, complete: I44.2

## 2021-06-02 DIAGNOSIS — H16041 Marginal corneal ulcer, right eye: Secondary | ICD-10-CM | POA: Diagnosis not present

## 2021-06-04 ENCOUNTER — Telehealth: Payer: Self-pay | Admitting: Student

## 2021-06-04 DIAGNOSIS — I442 Atrioventricular block, complete: Secondary | ICD-10-CM

## 2021-06-04 DIAGNOSIS — R42 Dizziness and giddiness: Secondary | ICD-10-CM | POA: Diagnosis not present

## 2021-06-04 DIAGNOSIS — R002 Palpitations: Secondary | ICD-10-CM | POA: Diagnosis not present

## 2021-06-04 NOTE — Telephone Encounter (Signed)
   Received page from [facility] about critical result. Called and spoke with staff.  Patient's XT Zio monitor was received and showed multiple concerning findings.  Monitor showed: -2 episodes of complete heart block on 05/15/2021 around 12 PM.  Heart rates were ranging between 34 to 37 bpm during both of these episodes and lasted around 7 to 8 seconds. -4 episodes of ventricular systole due to high-grade AV block.  She 1 episode on 05/13/2021, 1 episode on 05/16/2021, and 2 episodes on 05/19/2021.  Longest episode about 10 seconds.  He also had a couple episodes of symptomatic high-grade AV block and symptomatic Mobitz type II.  No date and time of these episodes available at time of call.  Full report will be uploaded at some point today.  Called and spoke with patient.  She states she was seen by neurology and plan is for EEG later this month.  Neurologist recommended stopping Lipitor and dizziness has significantly improved.  She denies any recent syncope over the last month.  No chest pain or shortness of breath.  She is overall feeling much better.  Reviewed above results with patient and daughter.  I will place referral to EP and send EP scheduler a message to help arrange this.  However, emphasized the importance of proceeding to the ED if she has recurrent significant dizziness, near-syncope, or syncope.  Daughter voiced understanding and agreed.  Of note, I did review this plan with Dr. Rayann Heman who agreed.  Given symptoms have significantly improved off of Lipitor, I advised patient that it is okay to continue to hold this for now. We may need to try a non-statin medication for her hyperlipidemia such as Zetia.  Also recommended she update her Neurologist on monitor findings to determine if EEG is still needed.  I will also route this note to Dr. Saunders Revel so he is aware.  Darreld Mclean, PA-C 06/04/2021 1:00 PM

## 2021-06-05 ENCOUNTER — Emergency Department: Payer: PPO

## 2021-06-05 ENCOUNTER — Inpatient Hospital Stay
Admission: EM | Admit: 2021-06-05 | Discharge: 2021-06-06 | DRG: 309 | Disposition: A | Discharge status: Other Acute Inpatient Hospital | Payer: PPO | Attending: Internal Medicine | Admitting: Internal Medicine

## 2021-06-05 ENCOUNTER — Other Ambulatory Visit: Payer: Self-pay

## 2021-06-05 DIAGNOSIS — S0033XA Contusion of nose, initial encounter: Secondary | ICD-10-CM | POA: Diagnosis present

## 2021-06-05 DIAGNOSIS — Z79899 Other long term (current) drug therapy: Secondary | ICD-10-CM

## 2021-06-05 DIAGNOSIS — I2510 Atherosclerotic heart disease of native coronary artery without angina pectoris: Secondary | ICD-10-CM | POA: Diagnosis present

## 2021-06-05 DIAGNOSIS — I252 Old myocardial infarction: Secondary | ICD-10-CM | POA: Diagnosis not present

## 2021-06-05 DIAGNOSIS — R55 Syncope and collapse: Secondary | ICD-10-CM | POA: Diagnosis not present

## 2021-06-05 DIAGNOSIS — Z79890 Hormone replacement therapy (postmenopausal): Secondary | ICD-10-CM

## 2021-06-05 DIAGNOSIS — W1830XA Fall on same level, unspecified, initial encounter: Secondary | ICD-10-CM | POA: Diagnosis present

## 2021-06-05 DIAGNOSIS — I442 Atrioventricular block, complete: Principal | ICD-10-CM | POA: Diagnosis present

## 2021-06-05 DIAGNOSIS — M50323 Other cervical disc degeneration at C6-C7 level: Secondary | ICD-10-CM | POA: Diagnosis not present

## 2021-06-05 DIAGNOSIS — I5032 Chronic diastolic (congestive) heart failure: Secondary | ICD-10-CM | POA: Diagnosis not present

## 2021-06-05 DIAGNOSIS — Z8249 Family history of ischemic heart disease and other diseases of the circulatory system: Secondary | ICD-10-CM

## 2021-06-05 DIAGNOSIS — E785 Hyperlipidemia, unspecified: Secondary | ICD-10-CM | POA: Diagnosis not present

## 2021-06-05 DIAGNOSIS — J984 Other disorders of lung: Secondary | ICD-10-CM | POA: Diagnosis not present

## 2021-06-05 DIAGNOSIS — I5030 Unspecified diastolic (congestive) heart failure: Secondary | ICD-10-CM | POA: Diagnosis not present

## 2021-06-05 DIAGNOSIS — E039 Hypothyroidism, unspecified: Secondary | ICD-10-CM | POA: Diagnosis not present

## 2021-06-05 DIAGNOSIS — S199XXA Unspecified injury of neck, initial encounter: Secondary | ICD-10-CM | POA: Diagnosis not present

## 2021-06-05 DIAGNOSIS — Z7982 Long term (current) use of aspirin: Secondary | ICD-10-CM | POA: Diagnosis not present

## 2021-06-05 DIAGNOSIS — S0003XA Contusion of scalp, initial encounter: Secondary | ICD-10-CM | POA: Diagnosis present

## 2021-06-05 DIAGNOSIS — Z20822 Contact with and (suspected) exposure to covid-19: Secondary | ICD-10-CM | POA: Diagnosis not present

## 2021-06-05 LAB — RESP PANEL BY RT-PCR (FLU A&B, COVID) ARPGX2
Influenza A by PCR: NEGATIVE
Influenza B by PCR: NEGATIVE
SARS Coronavirus 2 by RT PCR: NEGATIVE

## 2021-06-05 LAB — BASIC METABOLIC PANEL
Anion gap: 6 (ref 5–15)
BUN: 26 mg/dL — ABNORMAL HIGH (ref 8–23)
CO2: 30 mmol/L (ref 22–32)
Calcium: 9.3 mg/dL (ref 8.9–10.3)
Chloride: 102 mmol/L (ref 98–111)
Creatinine, Ser: 0.7 mg/dL (ref 0.44–1.00)
GFR, Estimated: >60 mL/min (ref >60)
Glucose, Bld: 100 mg/dL — ABNORMAL HIGH (ref 70–99)
Potassium: 4.2 mmol/L (ref 3.5–5.1)
Sodium: 138 mmol/L (ref 135–145)

## 2021-06-05 LAB — CBC
HCT: 43.6 % (ref 36.0–46.0)
Hemoglobin: 15 g/dL (ref 12.0–15.0)
MCH: 31.3 pg (ref 26.0–34.0)
MCHC: 34.4 g/dL (ref 30.0–36.0)
MCV: 90.8 fL (ref 80.0–100.0)
Platelets: 259 10*3/uL (ref 150–400)
RBC: 4.8 MIL/uL (ref 3.87–5.11)
RDW: 13.7 % (ref 11.5–15.5)
WBC: 6.4 10*3/uL (ref 4.0–10.5)
nRBC: 0 % (ref 0.0–0.2)

## 2021-06-05 LAB — URINALYSIS, COMPLETE (UACMP) WITH MICROSCOPIC
Bilirubin Urine: NEGATIVE
Glucose, UA: NEGATIVE mg/dL
Hgb urine dipstick: NEGATIVE
Ketones, ur: NEGATIVE mg/dL
Leukocytes,Ua: NEGATIVE
Nitrite: NEGATIVE
Protein, ur: NEGATIVE mg/dL
Specific Gravity, Urine: 1.004 — ABNORMAL LOW (ref 1.005–1.030)
pH: 8 (ref 5.0–8.0)

## 2021-06-05 LAB — GLUCOSE, CAPILLARY: Glucose-Capillary: 95 mg/dL (ref 70–99)

## 2021-06-05 LAB — MAGNESIUM: Magnesium: 2.7 mg/dL — ABNORMAL HIGH (ref 1.7–2.4)

## 2021-06-05 LAB — APTT: aPTT: 28 seconds (ref 24–36)

## 2021-06-05 LAB — BRAIN NATRIURETIC PEPTIDE: B Natriuretic Peptide: 80.7 pg/mL (ref 0.0–100.0)

## 2021-06-05 LAB — MRSA NEXT GEN BY PCR, NASAL: MRSA by PCR Next Gen: NOT DETECTED

## 2021-06-05 LAB — PROTIME-INR
INR: 0.9 (ref 0.8–1.2)
Prothrombin Time: 12.3 seconds (ref 11.4–15.2)

## 2021-06-05 LAB — TROPONIN I (HIGH SENSITIVITY)
Troponin I (High Sensitivity): 9 ng/L (ref <18)
Troponin I (High Sensitivity): 11 ng/L (ref <18)

## 2021-06-05 LAB — CBG MONITORING, ED: Glucose-Capillary: 113 mg/dL — ABNORMAL HIGH (ref 70–99)

## 2021-06-05 MED ORDER — HYDRALAZINE HCL 20 MG/ML IJ SOLN: 5.0000 mg | INTRAMUSCULAR | Status: DC | PRN

## 2021-06-05 MED ORDER — GATIFLOXACIN 0.5 % OP SOLN
1.0000 [drp] | Freq: Four times a day (QID) | OPHTHALMIC | Status: DC
  Administered 2021-06-05 – 2021-06-06 (×2): 1 [drp] via OPHTHALMIC
  Filled 2021-06-05: qty 2.5

## 2021-06-05 MED ORDER — ERYTHROMYCIN 5 MG/GM OP OINT
1.0000 "application " | TOPICAL_OINTMENT | Freq: Every day | OPHTHALMIC | Status: DC
  Administered 2021-06-05: 1 via OPHTHALMIC
  Filled 2021-06-05: qty 1

## 2021-06-05 MED ORDER — LEVOTHYROXINE SODIUM 137 MCG PO TABS
68.5000 ug | ORAL_TABLET | Freq: Every day | ORAL | Status: DC
  Administered 2021-06-06: 68.5 ug via ORAL
  Filled 2021-06-05: qty 0.5

## 2021-06-05 MED ORDER — ATROPINE SULFATE 1 MG/10ML IJ SOSY: 1.0000 mg | PREFILLED_SYRINGE | Freq: Every day | INTRAMUSCULAR | Status: DC | PRN

## 2021-06-05 MED ORDER — ASPIRIN EC 81 MG PO TBEC
81.0000 mg | DELAYED_RELEASE_TABLET | Freq: Every day | ORAL | Status: DC
  Administered 2021-06-06: 81 mg via ORAL
  Filled 2021-06-05: qty 1

## 2021-06-05 MED ORDER — ACETAMINOPHEN 160 MG/5ML PO SOLN
650.0000 mg | Freq: Four times a day (QID) | ORAL | Status: DC | PRN
  Filled 2021-06-05: qty 20.3

## 2021-06-05 MED ORDER — ONDANSETRON HCL 4 MG/2ML IJ SOLN
4.0000 mg | Freq: Three times a day (TID) | INTRAMUSCULAR | Status: DC | PRN
Start: 2021-06-05 — End: 2021-06-06

## 2021-06-05 NOTE — ED Triage Notes (Signed)
See first nurse note- Pt to ER after syncopal episode today. Reports hitting back of head and top of nose. Bruising present to nose. Denies blood thinner usage- takes daily baby aspirin. Reports having multiple dizzy spells/ feeling light headed this morning prior to syncopal episode.   Reports wearing a heart monitor x2 weeks and removed it a week ago Friday. Denies CP. Denies feeling dizzy now.

## 2021-06-05 NOTE — H&P (Signed)
History and Physical    Lori Rosario KPT:[id number] DOB: 1940-12-24 DOA: 06/05/2021  Referring MD/NP/PA:   PCP: Sofie Hartigan, MD   Patient coming from:  The patient is coming from home.      Chief Complaint: syncope  HPI: Lori Rosario is a 80 y.o. female with medical history significant of dCHF, hyperlipidemia, hypothyroidism, Takotsubo cardiomyopathy, CAD, STEMI, who presents with syncope.   Pt states that she has dizziness and lightheadedness for about 2 months. She has had mutiple episodes of syncope recently.   Had a cardiac monitor placed 05/13/2021, resulted yesterday showing high degree AV block, with complete heart block and bradycardia and also Mobitz type II AVB. This morning, she had another episode of loss of consciousness. She reports hitting back of head and top of nose. Has bruise to nose and hematoma in the left parietal scalp. She states that she had dizziness and lightheadedness prior to syncope.  Currently patient does not have unilateral numbness or tingling to the extremities.  No facial droop or slurred speech.  Patient denies chest pain, cough, shortness breath, fever or chills.  No nausea, vomiting, diarrhea or abdominal pain.  No symptoms of UTI   ED Course: pt was found to have WBC 6.4, troponin level 9, BNP 80.7, negative urinalysis, pending COVID-19 PCR, electrolytes renal function okay, temperature normal, blood pressure 150/67, heart rate 88, RR 16, oxygen saturation 95% on room air.  Chest x-ray negative.  CT of the head is negative for acute intracranial abnormalities, but showed left parietal scalp hematoma.  CT of C-spine is negative for bony fracture, but showed degenerative disc disease.  Patient is admitted to stepdown as inpatient.  Dr. Garen Lah of cardiology and Dr. Lanney Gins of ICU are consulted  Review of Systems:   General: no fevers, chills, no body weight gain, fatigue HEENT: no blurry vision, hearing changes or sore throat Respiratory:  no dyspnea, coughing, wheezing CV: no chest pain, no palpitations GI: no nausea, vomiting, abdominal pain, diarrhea, constipation GU: no dysuria, burning on urination, increased urinary frequency, hematuria  Ext: has trace leg edema Neuro: no unilateral weakness, numbness, or tingling, no vision change or hearing loss. Has syncope in the lightheadedness. Skin: no rash, no skin tear. MSK: No muscle spasm, no deformity, no limitation of range of movement in spin Heme: No easy bruising.  Travel history: No recent long distant travel.  Allergy: No Known Allergies  Past Medical History:  Diagnosis Date   CAD (coronary artery disease)    HFrEF (heart failure with reduced ejection fraction) (HCC)    Hyperlipidemia LDL goal <70    Takotsubo cardiomyopathy    Thyroid disease     Past Surgical History:  Procedure Laterality Date   COLONOSCOPY  2013   cleared for 10 yrs- Dr @ [place] CATH AND CORONARY ANGIOGRAPHY N/A 03/04/2021   Procedure: LEFT HEART CATH AND CORONARY ANGIOGRAPHY;  Surgeon: Nelva Bush, MD;  Location: [facility];  Service: Cardiovascular;  Laterality: N/A;    Social History:  reports that she has never smoked. She has never used smokeless tobacco. She reports that she does not drink alcohol and does not use drugs.  Family History:  Family History  Problem Relation Age of Onset   Heart disease Father      Prior to Admission medications   Medication Sig Start Date End Date Taking? Authorizing Provider  aspirin EC 81 MG EC tablet  Take 1 tablet (81 mg total) by mouth daily. Swallow whole. 03/06/21   Loletha Grayer, MD  atorvastatin (LIPITOR) 40 MG tablet Take 1 tablet (40 mg total) by mouth daily. 03/06/21   Loletha Grayer, MD  calcium citrate-vitamin D (CITRACAL+D) 315-200 MG-UNIT tablet Take 1 tablet by mouth daily.    [provider]  furosemide (LASIX) 20 MG tablet Take 1 tablet (20 mg total) by mouth daily  as needed for edema (or shortness of breath). 03/05/21   Loletha Grayer, MD  meclizine (ANTIVERT) 25 MG tablet Take 1 tablet (25 mg total) by mouth 3 (three) times daily as needed for dizziness. 05/06/21   Lucrezia Starch, MD  Multiple Vitamins-Minerals (CENTRUM SILVER 50+WOMEN PO) Take 1 tablet by mouth daily.    [provider]  SYNTHROID 137 MCG tablet Take one-half tablet at bedtime. 02/22/21   [provider]  vitamin E 180 MG (400 UNITS) capsule Take by mouth daily.    [provider]    Physical Exam: Vitals:   06/05/21 1235 06/05/21 1237 06/05/21 1348 06/05/21 1625  BP: 137/67  (!) 150/67 (!) 151/67  Pulse: 88  84 85  Resp: 16  10 20   Temp:  98.3 F (36.8 C)    TempSrc:  Oral    SpO2: 96%  95% 96%  Weight:      Height:       General: Not in acute distress HEENT:       Eyes: PERRL, EOMI, no scleral icterus.       ENT: No discharge from the ears and nose, no pharynx injection, no tonsillar enlargement.        Neck: No JVD, no bruit, no mass felt. Heme: No neck lymph node enlargement. Cardiac: S1/S2, RRR, No murmurs, No gallops or rubs. Respiratory: No rales, wheezing, rhonchi or rubs. GI: Soft, nondistended, nontender, no rebound pain, no organomegaly, BS present. GU: No hematuria Ext: has trace leg edema bilaterally. 1+DP/PT pulse bilaterally. Musculoskeletal: No joint deformities, No joint redness or warmth, no limitation of ROM in spin. Skin: No rashes.  Neuro: Alert, oriented X3, cranial nerves II-XII grossly intact, moves all extremities normally. Psych: Patient is not psychotic, no suicidal or hemocidal ideation.  Labs on Admission: I have personally reviewed following labs and imaging studies  CBC: Recent Labs  Lab 06/05/21 1241  WBC 6.4  HGB 15.0  HCT 43.6  MCV 90.8  PLT 741   Basic Metabolic Panel: Recent Labs  Lab 06/05/21 1241 06/05/21 1457  NA 138  --   K 4.2  --   CL 102  --   CO2 30  --   GLUCOSE 100*  --   BUN 26*   --   CREATININE 0.70  --   CALCIUM 9.3  --   MG  --  2.7*   GFR: Estimated Creatinine Clearance: 44.4 mL/min (by C-G formula based on SCr of 0.7 mg/dL). Liver Function Tests: No results for input(s): AST, ALT, ALKPHOS, BILITOT, PROT, ALBUMIN in the last 168 hours. No results for input(s): LIPASE, AMYLASE in the last 168 hours. No results for input(s): AMMONIA in the last 168 hours. Coagulation Profile: No results for input(s): INR, PROTIME in the last 168 hours. Cardiac Enzymes: No results for input(s): CKTOTAL, CKMB, CKMBINDEX, TROPONINI in the last 168 hours. BNP (last 3 results) No results for input(s): PROBNP in the last 8760 hours. HbA1C: No results for input(s): HGBA1C in the last 72 hours. CBG: Recent Labs  Lab 06/05/21  [place]   Lipid Profile: No results for input(s): CHOL, HDL, LDLCALC, TRIG, CHOLHDL, LDLDIRECT in the last 72 hours. Thyroid Function Tests: No results for input(s): TSH, T4TOTAL, FREET4, T3FREE, THYROIDAB in the last 72 hours. Anemia Panel: No results for input(s): VITAMINB12, FOLATE, FERRITIN, TIBC, IRON, RETICCTPCT in the last 72 hours. Urine analysis:    Component Value Date/Time   COLORURINE YELLOW (A) 06/05/2021 1241   APPEARANCEUR HAZY (A) 06/05/2021 1241   LABSPEC 1.004 (L) 06/05/2021 1241   PHURINE 8.0 06/05/2021 1241   GLUCOSEU NEGATIVE 06/05/2021 1241   HGBUR NEGATIVE 06/05/2021 1241   BILIRUBINUR NEGATIVE 06/05/2021 1241   KETONESUR NEGATIVE 06/05/2021 1241   PROTEINUR NEGATIVE 06/05/2021 1241   NITRITE NEGATIVE 06/05/2021 1241   LEUKOCYTESUR NEGATIVE 06/05/2021 1241   Sepsis Labs: @LABRCNTIP (procalcitonin:4,lacticidven:4) ) Recent Results (from the past 240 hour(s))  Resp Panel by RT-PCR (Flu A&B, Covid) Nasopharyngeal Swab     Status: None   Collection Time: 06/05/21  1:55 PM   Specimen: Nasopharyngeal Swab; Nasopharyngeal(NP) swabs in vial transport medium  Result Value Ref Range Status   SARS Coronavirus 2 by RT  PCR NEGATIVE NEGATIVE Final    Comment: (NOTE) SARS-CoV-2 target nucleic acids are NOT DETECTED.  The SARS-CoV-2 RNA is generally detectable in upper respiratory specimens during the acute phase of infection. The lowest concentration of SARS-CoV-2 viral copies this assay can detect is 138 copies/mL. A negative result does not preclude SARS-Cov-2 infection and should not be used as the sole basis for treatment or other patient management decisions. A negative result may occur with  improper specimen collection/handling, submission of specimen other than nasopharyngeal swab, presence of viral mutation(s) within the areas targeted by this assay, and inadequate number of viral copies(<138 copies/mL). A negative result must be combined with clinical observations, patient history, and epidemiological information. The expected result is Negative.  Fact Sheet for Patients:  EntrepreneurPulse.com.au  Fact Sheet for Healthcare Providers:  IncredibleEmployment.be  This test is no t yet approved or cleared by the [place] FDA and  has been authorized for detection and/or diagnosis of SARS-CoV-2 by FDA under an Emergency Use Authorization (EUA). This EUA will remain  in effect (meaning this test can be used) for the duration of the COVID-19 declaration under Section 564(b)(1) of the Act, 21 U.S.C.section 360bbb-3(b)(1), unless the authorization is terminated  or revoked sooner.       Influenza A by PCR NEGATIVE NEGATIVE Final   Influenza B by PCR NEGATIVE NEGATIVE Final    Comment: (NOTE) The Xpert Xpress SARS-CoV-2/FLU/RSV plus assay is intended as an aid in the diagnosis of influenza from Nasopharyngeal swab specimens and should not be used as a sole basis for treatment. Nasal washings and aspirates are unacceptable for Xpert Xpress SARS-CoV-2/FLU/RSV testing.  Fact Sheet for Patients: EntrepreneurPulse.com.au  Fact Sheet for  Healthcare Providers: IncredibleEmployment.be  This test is not yet approved or cleared by the [place] FDA and has been authorized for detection and/or diagnosis of SARS-CoV-2 by FDA under an Emergency Use Authorization (EUA). This EUA will remain in effect (meaning this test can be used) for the duration of the COVID-19 declaration under Section 564(b)(1) of the Act, 21 U.S.C. section 360bbb-3(b)(1), unless the authorization is terminated or revoked.  Performed at [facility], [address] [place], [place] [address]      Radiological Exams on Admission: CT Head Wo Contrast  Result Date: 06/05/2021 CLINICAL DATA:  Syncopal episode today, multiple episodes recently, on heart  monitor which shows complete heart block and Mobitz 2, head trauma EXAM: CT HEAD WITHOUT CONTRAST TECHNIQUE: Contiguous axial images were obtained from the base of the skull through the vertex without intravenous contrast. Sagittal and coronal MPR images reconstructed from axial data set. COMPARISON:  MR brain 05/06/2021 FINDINGS: Brain: Normal ventricular morphology. Mild generalized atrophy. No midline shift or mass effect. Probable mild small vessel chronic ischemic changes of deep cerebral white matter. No intracranial hemorrhage, mass lesion, or evidence of acute infarction. No extra-axial fluid collection. Vascular: Mild atherosclerotic calcification of internal carotid arteries bilaterally at skull base Skull: Intact.  Small posterior LEFT parietal scalp hematoma noted. Sinuses/Orbits: Paranasal sinuses, mastoid air cells and middle ear cavities clear Other: N/A IMPRESSION: Atrophy with mild small vessel chronic ischemic changes of deep cerebral white matter. No acute intracranial abnormalities. Small posterior LEFT parietal scalp hematoma. Electronically Signed   By: Lavonia Dana M.D.   On: 06/05/2021 13:44   CT Cervical Spine Wo Contrast  Result Date: 06/05/2021 CLINICAL DATA:   Syncopal episode today, neck trauma EXAM: CT CERVICAL SPINE WITHOUT CONTRAST TECHNIQUE: Multidetector CT imaging of the cervical spine was performed without intravenous contrast. Multiplanar CT image reconstructions were also generated. COMPARISON:  None FINDINGS: Alignment: Normal Skull base and vertebrae: Osseous demineralization. Vertebral body heights maintained. Facet alignments normal. Minimal disc space narrowing and C6-C7. Remaining disc space heights maintained. Skull base intact. No fracture, subluxation, or bone destruction. Soft tissues and spinal canal: Prevertebral soft tissues normal thickness. Scattered atherosclerotic calcifications. Disc levels:  No specific abnormalities Upper chest: Minimal biapical scarring, lung apices otherwise clear Other: N/A IMPRESSION: Osseous demineralization with minimal degenerative disc disease changes at C6-C7. No acute cervical spine abnormalities. Electronically Signed   By: Lavonia Dana M.D.   On: 06/05/2021 13:48   DG Chest Portable 1 View  Result Date: 06/05/2021 CLINICAL DATA:  Syncope today. EXAM: PORTABLE CHEST 1 VIEW COMPARISON:  None. FINDINGS: The heart size and mediastinal contours are within normal limits. Both lungs are clear. The visualized skeletal structures are unremarkable. IMPRESSION: No active disease. Electronically Signed   By: Abelardo Diesel M.D.   On: 06/05/2021 13:24     EKG: I have personally reviewed.  Sinus rhythm, QTC 452, first-degree AV block, early R wave progression, bifascicular block  Assessment/Plan Principal Problem:   Third degree AV block (HCC) Active Problems:   Hyperlipidemia   Hypothyroidism   Syncope   CAD (coronary artery disease)   Chronic diastolic CHF (congestive heart failure) (HCC)   Third degree AV block ([place]): Currently patient is in sinus rhythm, heart rate 88.  Hemodynamically stable.  Patient is asymptomatic currently. Dr. Garen Lah of cardiology and Dr. Lanney Gins of ICU are  consulted  -Admitted to stepdown as inpatient -Pacemaker to the bedside -Avoid nodal blockers -As needed atropine for symptomatic bradycardia -Follow-up cardiology's recommendation for pacemaker placement  Hyperlipidemia: she is not taking Lipitor now -f/u with PCP  Hypothyroidism -Synthroid  Syncope: Most likely due to high-grade AV block.  CT head is negative for acute intracranial abnormalities.  No focal neuro deficit on physical examination -Frequent neuro check -Fall precaution  CAD (coronary artery disease) -Continue aspirin  Chronic dCHF: 2D echo on 04/20/2021 showed EF of 55 to 60%.  Patient has trace leg edema, no shortness of breath, no pulm edema on chest x-ray.  BNP is normal 80.7.  CHF seem to be compensated.  Patient's not taking Lasix currently. -Watch volume status closely   DVT ppx: SCD Code Status:  Full code per pt Family Communication:  Yes, patient's daughter  at bed side Disposition Plan:  Anticipate discharge back to previous environment Consults called:  Dr. Garen Lah of cardiology and Dr. Lanney Gins of ICU are consulted Admission status and Level of care: Stepdown:  as inpatient         Status is: Inpatient  Remains inpatient appropriate because:Inpatient level of care appropriate due to severity of illness  Dispo: The patient is from: Home              Anticipated d/c is to: Home              Patient currently is not medically stable to d/c.   Difficult to place patient No           Date of Service 06/05/2021    Ivor Costa [facility]   If 7PM-7AM, please contact night-coverage [email or web address] 06/05/2021, 4:43 PM

## 2021-06-05 NOTE — Consult Note (Signed)
CRITICAL CARE PROGRESS NOTE    Name: Lori Rosario MRN: [id number] DOB: 07-04-1941     LOS: 0   SUBJECTIVE FINDINGS & SIGNIFICANT EVENTS    Patient description:  This is a very pleasant 80 year old female who was in the emergency room with her daughter Lori Rosario was able to help [place] with details of history during my examination and interview.  Patient has a history of Takotsubo cardiomyopathy and recent MRI as well as diastolic CHF.  Also has a background history of hypothyroidism dyslipidemia.  She had seen cardiology recently and was placed on Holter monitoring.  She reported having episodes of disequilibrium cardiology and was instructed to seek medical attention the ED if things worsen or if she syncopized. She came into the hospital after having multiple episodes of having disequilibrium and dizziness and this morning felt lightheaded and then syncopized with injury to the occiput.  PCCM consultation placed for additional evaluation management while in the stepdown unit.  Lines/tubes :   Microbiology/Sepsis markers: Results for orders placed or performed during the hospital encounter of 06/05/21  Resp Panel by RT-PCR (Flu A&B, Covid) Nasopharyngeal Swab     Status: None   Collection Time: 06/05/21  1:55 PM   Specimen: Nasopharyngeal Swab; Nasopharyngeal(NP) swabs in vial transport medium  Result Value Ref Range Status   SARS Coronavirus 2 by RT PCR NEGATIVE NEGATIVE Final    Comment: (NOTE) SARS-CoV-2 target nucleic acids are NOT DETECTED.  The SARS-CoV-2 RNA is generally detectable in upper respiratory specimens during the acute phase of infection. The lowest concentration of SARS-CoV-2 viral copies this assay can detect is 138 copies/mL. A negative result does not preclude SARS-Cov-2 infection and should  not be used as the sole basis for treatment or other patient management decisions. A negative result may occur with  improper specimen collection/handling, submission of specimen other than nasopharyngeal swab, presence of viral mutation(s) within the areas targeted by this assay, and inadequate number of viral copies(<138 copies/mL). A negative result must be combined with clinical observations, patient history, and epidemiological information. The expected result is Negative.  Fact Sheet for Patients:  EntrepreneurPulse.com.au  Fact Sheet for Healthcare Providers:  IncredibleEmployment.be  This test is no t yet approved or cleared by the [place] FDA and  has been authorized for detection and/or diagnosis of SARS-CoV-2 by FDA under an Emergency Use Authorization (EUA). This EUA will remain  in effect (meaning this test can be used) for the duration of the COVID-19 declaration under Section 564(b)(1) of the Act, 21 U.S.C.section 360bbb-3(b)(1), unless the authorization is terminated  or revoked sooner.       Influenza A by PCR NEGATIVE NEGATIVE Final   Influenza B by PCR NEGATIVE NEGATIVE Final    Comment: (NOTE) The Xpert Xpress SARS-CoV-2/FLU/RSV plus assay is intended as an aid in the diagnosis of influenza from Nasopharyngeal swab specimens and should not be used as a sole basis for treatment. Nasal washings and aspirates are unacceptable for Xpert Xpress SARS-CoV-2/FLU/RSV testing.  Fact Sheet for Patients: EntrepreneurPulse.com.au  Fact Sheet for Healthcare Providers: IncredibleEmployment.be  This test is not yet approved or cleared by the [place] FDA and has been authorized for detection and/or diagnosis of SARS-CoV-2 by FDA under an Emergency Use Authorization (EUA). This EUA will remain in effect (meaning this test can be used) for the duration of the COVID-19 declaration under  Section 564(b)(1) of the Act, 21 U.S.C. section 360bbb-3(b)(1), unless the authorization is terminated or revoked.  Performed  at [facility], [address] [place], [place] [address]     Anti-infectives:  Anti-infectives (From admission, onward)    None        Consults: Treatment Team:  Kate Sable, MD Ottie Glazier, MD     PAST MEDICAL HISTORY   Past Medical History:  Diagnosis Date   CAD (coronary artery disease)    HFrEF (heart failure with reduced ejection fraction) ([place])    Hyperlipidemia LDL goal <70    Takotsubo cardiomyopathy    Thyroid disease      SURGICAL HISTORY   Past Surgical History:  Procedure Laterality Date   COLONOSCOPY  2013   cleared for 10 yrs- Dr @ [place] CATH AND CORONARY ANGIOGRAPHY N/A 03/04/2021   Procedure: LEFT HEART CATH AND CORONARY ANGIOGRAPHY;  Surgeon: Nelva Bush, MD;  Location: [facility];  Service: Cardiovascular;  Laterality: N/A;     FAMILY HISTORY   Family History  Problem Relation Age of Onset   Heart disease Father      SOCIAL HISTORY   Social History   Tobacco Use   Smoking status: Never   Smokeless tobacco: Never  Vaping Use   Vaping Use: Never used  Substance Use Topics   Alcohol use: No    Alcohol/week: 0.0 standard drinks   Drug use: No     MEDICATIONS   Current Medication:  Current Facility-Administered Medications:    acetaminophen (TYLENOL) 160 MG/5ML solution 650 mg, 650 mg, Oral, Q6H PRN, Ivor Costa, MD   atropine 1 MG/10ML injection 1 mg, 1 mg, Intravenous, Daily PRN, Ivor Costa, MD   hydrALAZINE (APRESOLINE) injection 5 mg, 5 mg, Intravenous, Q2H PRN, Ivor Costa, MD   ondansetron (ZOFRAN) injection 4 mg, 4 mg, Intravenous, Q8H PRN, Ivor Costa, MD  Current Outpatient Medications:    aspirin EC 81 MG EC tablet, Take 1 tablet (81 mg total) by mouth daily. Swallow whole., Disp: 30 tablet, Rfl: 0   erythromycin  ophthalmic ointment, Place 1 application into the right eye at bedtime., Disp: , Rfl:    gatifloxacin (ZYMAXID) 0.5 % SOLN, Place 1 drop into the right eye 4 (four) times daily., Disp: , Rfl:    SYNTHROID 137 MCG tablet, Take 68.5 mcg by mouth daily., Disp: , Rfl:    atorvastatin (LIPITOR) 40 MG tablet, Take 1 tablet (40 mg total) by mouth daily. (Patient not taking: Reported on 06/05/2021), Disp: 30 tablet, Rfl: 0   furosemide (LASIX) 20 MG tablet, Take 1 tablet (20 mg total) by mouth daily as needed for edema (or shortness of breath). (Patient not taking: Reported on 06/05/2021), Disp: 30 tablet, Rfl: 0   meclizine (ANTIVERT) 25 MG tablet, Take 1 tablet (25 mg total) by mouth 3 (three) times daily as needed for dizziness. (Patient not taking: Reported on 06/05/2021), Disp: 30 tablet, Rfl: 0    ALLERGIES   Patient has no known allergies.    REVIEW OF SYSTEMS    10 point ROS conducted and is negative except for pain in the back of occiput status post mechanical fall  PHYSICAL EXAMINATION   Vital Signs: Temp:  [98.3 F (36.8 C)] 98.3 F (36.8 C) (10/09 1237) Pulse Rate:  [84-88] 84 (10/09 1348) Resp:  [10-16] 10 (10/09 1348) BP: (137-150)/(67) 150/67 (10/09 1348) SpO2:  [95 %-96 %] 95 % (10/09 1348) Weight:  [54.4 kg] 54.4 kg (10/09 1233)  GENERAL: Age-appropriate no distress HEAD: Normocephalic, atraumatic.  EYES: Pupils equal, round, reactive to light.  No scleral icterus.  MOUTH: Moist mucosal membrane. NECK: Supple. No thyromegaly. No nodules. No JVD.  PULMONARY: There to auscultation bilaterally CARDIOVASCULAR: S1 and S2. Regular rate and rhythm. No murmurs, rubs, or gallops.  GASTROINTESTINAL: Soft, nontender, non-distended. No masses. Positive bowel sounds. No hepatosplenomegaly.  MUSCULOSKELETAL: No swelling, clubbing, or edema.  NEUROLOGIC: No FND's grossly SKIN:intact,warm,dry   PERTINENT DATA     Infusions:  Scheduled Medications:  PRN  Medications: acetaminophen (TYLENOL) oral liquid 160 mg/5 mL, atropine, hydrALAZINE, ondansetron (ZOFRAN) IV Hemodynamic parameters:   Intake/Output: No intake/output data recorded.  Ventilator  Settings:    LAB RESULTS:  Basic Metabolic Panel: Recent Labs  Lab 06/05/21 1241 06/05/21 1457  NA 138  --   K 4.2  --   CL 102  --   CO2 30  --   GLUCOSE 100*  --   BUN 26*  --   CREATININE 0.70  --   CALCIUM 9.3  --   MG  --  2.7*   Liver Function Tests: No results for input(s): AST, ALT, ALKPHOS, BILITOT, PROT, ALBUMIN in the last 168 hours. No results for input(s): LIPASE, AMYLASE in the last 168 hours. No results for input(s): AMMONIA in the last 168 hours. CBC: Recent Labs  Lab 06/05/21 1241  WBC 6.4  HGB 15.0  HCT 43.6  MCV 90.8  PLT 259   Cardiac Enzymes: No results for input(s): CKTOTAL, CKMB, CKMBINDEX, TROPONINI in the last 168 hours. BNP: Invalid input(s): POCBNP CBG: Recent Labs  Lab 06/05/21 1302  GLUCAP 113*       IMAGING RESULTS:  Imaging: CT Head Wo Contrast  Result Date: 06/05/2021 CLINICAL DATA:  Syncopal episode today, multiple episodes recently, on heart monitor which shows complete heart block and Mobitz 2, head trauma EXAM: CT HEAD WITHOUT CONTRAST TECHNIQUE: Contiguous axial images were obtained from the base of the skull through the vertex without intravenous contrast. Sagittal and coronal MPR images reconstructed from axial data set. COMPARISON:  MR brain 05/06/2021 FINDINGS: Brain: Normal ventricular morphology. Mild generalized atrophy. No midline shift or mass effect. Probable mild small vessel chronic ischemic changes of deep cerebral white matter. No intracranial hemorrhage, mass lesion, or evidence of acute infarction. No extra-axial fluid collection. Vascular: Mild atherosclerotic calcification of internal carotid arteries bilaterally at skull base Skull: Intact.  Small posterior LEFT parietal scalp hematoma noted. Sinuses/Orbits:  Paranasal sinuses, mastoid air cells and middle ear cavities clear Other: N/A IMPRESSION: Atrophy with mild small vessel chronic ischemic changes of deep cerebral white matter. No acute intracranial abnormalities. Small posterior LEFT parietal scalp hematoma. Electronically Signed   By: Lavonia Dana M.D.   On: 06/05/2021 13:44   CT Cervical Spine Wo Contrast  Result Date: 06/05/2021 CLINICAL DATA:  Syncopal episode today, neck trauma EXAM: CT CERVICAL SPINE WITHOUT CONTRAST TECHNIQUE: Multidetector CT imaging of the cervical spine was performed without intravenous contrast. Multiplanar CT image reconstructions were also generated. COMPARISON:  None FINDINGS: Alignment: Normal Skull base and vertebrae: Osseous demineralization. Vertebral body heights maintained. Facet alignments normal. Minimal disc space narrowing and C6-C7. Remaining disc space heights maintained. Skull base intact. No fracture, subluxation, or bone destruction. Soft tissues and spinal canal: Prevertebral soft tissues normal thickness. Scattered atherosclerotic calcifications. Disc levels:  No specific abnormalities Upper chest: Minimal biapical scarring, lung apices otherwise clear Other: N/A IMPRESSION: Osseous demineralization with minimal degenerative disc disease changes at C6-C7. No acute cervical spine abnormalities. Electronically Signed  By: Lavonia Dana M.D.   On: 06/05/2021 13:48   DG Chest Portable 1 View  Result Date: 06/05/2021 CLINICAL DATA:  Syncope today. EXAM: PORTABLE CHEST 1 VIEW COMPARISON:  None. FINDINGS: The heart size and mediastinal contours are within normal limits. Both lungs are clear. The visualized skeletal structures are unremarkable. IMPRESSION: No active disease. Electronically Signed   By: Abelardo Diesel M.D.   On: 06/05/2021 13:24   @PROBHOSP @ CT Head Wo Contrast  Result Date: 06/05/2021 CLINICAL DATA:  Syncopal episode today, multiple episodes recently, on heart monitor which shows complete heart block  and Mobitz 2, head trauma EXAM: CT HEAD WITHOUT CONTRAST TECHNIQUE: Contiguous axial images were obtained from the base of the skull through the vertex without intravenous contrast. Sagittal and coronal MPR images reconstructed from axial data set. COMPARISON:  MR brain 05/06/2021 FINDINGS: Brain: Normal ventricular morphology. Mild generalized atrophy. No midline shift or mass effect. Probable mild small vessel chronic ischemic changes of deep cerebral white matter. No intracranial hemorrhage, mass lesion, or evidence of acute infarction. No extra-axial fluid collection. Vascular: Mild atherosclerotic calcification of internal carotid arteries bilaterally at skull base Skull: Intact.  Small posterior LEFT parietal scalp hematoma noted. Sinuses/Orbits: Paranasal sinuses, mastoid air cells and middle ear cavities clear Other: N/A IMPRESSION: Atrophy with mild small vessel chronic ischemic changes of deep cerebral white matter. No acute intracranial abnormalities. Small posterior LEFT parietal scalp hematoma. Electronically Signed   By: Lavonia Dana M.D.   On: 06/05/2021 13:44   CT Cervical Spine Wo Contrast  Result Date: 06/05/2021 CLINICAL DATA:  Syncopal episode today, neck trauma EXAM: CT CERVICAL SPINE WITHOUT CONTRAST TECHNIQUE: Multidetector CT imaging of the cervical spine was performed without intravenous contrast. Multiplanar CT image reconstructions were also generated. COMPARISON:  None FINDINGS: Alignment: Normal Skull base and vertebrae: Osseous demineralization. Vertebral body heights maintained. Facet alignments normal. Minimal disc space narrowing and C6-C7. Remaining disc space heights maintained. Skull base intact. No fracture, subluxation, or bone destruction. Soft tissues and spinal canal: Prevertebral soft tissues normal thickness. Scattered atherosclerotic calcifications. Disc levels:  No specific abnormalities Upper chest: Minimal biapical scarring, lung apices otherwise clear Other: N/A  IMPRESSION: Osseous demineralization with minimal degenerative disc disease changes at C6-C7. No acute cervical spine abnormalities. Electronically Signed   By: Lavonia Dana M.D.   On: 06/05/2021 13:48   DG Chest Portable 1 View  Result Date: 06/05/2021 CLINICAL DATA:  Syncope today. EXAM: PORTABLE CHEST 1 VIEW COMPARISON:  None. FINDINGS: The heart size and mediastinal contours are within normal limits. Both lungs are clear. The visualized skeletal structures are unremarkable. IMPRESSION: No active disease. Electronically Signed   By: Abelardo Diesel M.D.   On: 06/05/2021 13:24     ASSESSMENT AND PLAN    Acute lightheadedness and syncope Due to third-degree AV block Status post Holter monitoring with cardiology Patient was asked to come into the ER due to recurrent disequilibrium status post mechanical fall -Cardiology consultation -Telemetry monitoring while in the stepdown unit ID -continue IV abx as prescibed -follow up cultures  GI/Nutrition GI PROPHYLAXIS as indicated DIET-->TF's as tolerated Constipation protocol as indicated  ENDO - ICU hypoglycemic\Hyperglycemia protocol -check FSBS per protocol   ELECTROLYTES -follow labs as needed -replace as needed -pharmacy consultation   DVT/GI PRX ordered -SCDs  TRANSFUSIONS AS NEEDED MONITOR FSBS ASSESS the need for LABS as needed   Critical care provider statement:   Total critical care time: 33 minutes   Performed by: Lanney Gins MD  Critical care time was exclusive of separately billable procedures and treating other patients.   Critical care was necessary to treat or prevent imminent or life-threatening deterioration.   Critical care was time spent personally by me on the following activities: development of treatment plan with patient and/or surrogate as well as nursing, discussions with consultants, evaluation of patient's response to treatment, examination of patient, obtaining history from patient or surrogate,  ordering and performing treatments and interventions, ordering and review of laboratory studies, ordering and review of radiographic studies, pulse oximetry and re-evaluation of patient's condition.      This document was prepared using Dragon voice recognition software and may include unintentional dictation errors.    Ottie Glazier, M.D.  Division of [place]

## 2021-06-05 NOTE — ED Notes (Signed)
Pt visualized in NAD, resting in bed with eyes closed. Call bell within reach of patient at this time.

## 2021-06-05 NOTE — ED Notes (Signed)
Patient here after syncopal episode this morning, falling backwards. Patient has a hematoma to the back of the head. Also has bruising below tight eye and to bridge of the nose. Denies dizziness or blurred vision. Patient recently wore Holter monitor for dizziness. States she was told she was having "ventricular pauses" and is awaiting appointment with cardiology.

## 2021-06-05 NOTE — ED Provider Notes (Addendum)
[facility] Provider Note   ____________________________________________   Event Date/Time   First MD Initiated Contact with Patient 06/05/21 1257     (approximate)  I have reviewed the triage vital signs and the nursing notes.   HISTORY  Chief Complaint Loss of Consciousness    HPI Lori Rosario is a 80 y.o. female who had a long-term monitor that showed third-degree AV block intermittently and second-degree type II Mobitz block also intermittently monitor was apparently placed on the 16th of last month.  Patient has had syncope today.  She fell and hit her head on the kitchen floor.  She does not have any nausea vomiting diarrhea severe headache or neck pain. She does not have any chest pain either.      Past Medical History:  Diagnosis Date   CAD (coronary artery disease)    HFrEF (heart failure with reduced ejection fraction) (HCC)    Hyperlipidemia LDL goal <70    Takotsubo cardiomyopathy    Thyroid disease     Patient Active Problem List   Diagnosis Date Noted   Takotsubo cardiomyopathy 03/04/2021   NSTEMI (non-ST elevated myocardial infarction) ([place]) 03/04/2021   ST elevation myocardial infarction (STEMI) ([place])    Hyperlipidemia 03/04/2015   Dizziness 02/10/2014   Hypothyroidism 02/19/2012    Past Surgical History:  Procedure Laterality Date   COLONOSCOPY  2013   cleared for 10 yrs- Dr @ [place]     LEFT HEART CATH AND CORONARY ANGIOGRAPHY N/A 03/04/2021   Procedure: LEFT HEART CATH AND CORONARY ANGIOGRAPHY;  Surgeon: Nelva Bush, MD;  Location: [place] [facility];  Service: Cardiovascular;  Laterality: N/A;    Prior to Admission medications   Medication Sig Start Date End Date Taking? Authorizing Provider  aspirin EC 81 MG EC tablet Take 1 tablet (81 mg total) by mouth daily. Swallow whole. 03/06/21   Loletha Grayer, MD  atorvastatin (LIPITOR) 40 MG tablet Take 1 tablet (40 mg  total) by mouth daily. 03/06/21   Loletha Grayer, MD  calcium citrate-vitamin D (CITRACAL+D) 315-200 MG-UNIT tablet Take 1 tablet by mouth daily.    [provider]  furosemide (LASIX) 20 MG tablet Take 1 tablet (20 mg total) by mouth daily as needed for edema (or shortness of breath). 03/05/21   Loletha Grayer, MD  meclizine (ANTIVERT) 25 MG tablet Take 1 tablet (25 mg total) by mouth 3 (three) times daily as needed for dizziness. 05/06/21   Lucrezia Starch, MD  Multiple Vitamins-Minerals (CENTRUM SILVER 50+WOMEN PO) Take 1 tablet by mouth daily.    [provider]  SYNTHROID 137 MCG tablet Take one-half tablet at bedtime. 02/22/21   [provider]  vitamin E 180 MG (400 UNITS) capsule Take by mouth daily.    [provider]    Allergies Patient has no known allergies.  Family History  Problem Relation Age of Onset   Heart disease Father     Social History Social History   Tobacco Use   Smoking status: Never   Smokeless tobacco: Never  Vaping Use   Vaping Use: Never used  Substance Use Topics   Alcohol use: No    Alcohol/week: 0.0 standard drinks   Drug use: No    Review of Systems  Constitutional: No fever/chills Eyes: No visual changes. ENT: No sore throat. Cardiovascular: Denies chest pain. Respiratory: Denies shortness of breath. Gastrointestinal: No abdominal pain.  No nausea, no vomiting.  No  diarrhea.  No constipation. Genitourinary: Negative for dysuria. Musculoskeletal: Negative for back pain. Skin: Negative for rash. Neurological: Negative for headaches, focal weakness   ____________________________________________   PHYSICAL EXAM:  VITAL SIGNS: ED Triage Vitals  Enc Vitals Group     BP 06/05/21 1235 137/67     Pulse Rate 06/05/21 1235 88     Resp 06/05/21 1235 16     Temp 06/05/21 1237 98.3 F (36.8 C)     Temp Source 06/05/21 1237 Oral     SpO2 06/05/21 1235 96 %     Weight 06/05/21 1233 120 lb (54.4 kg)      Height 06/05/21 1233 5\' 2"  (1.575 m)     Head Circumference --      Peak Flow --      Pain Score 06/05/21 1233 0     Pain Loc --      Pain Edu? --      Excl. in [place]? --     Constitutional: Alert and oriented. Well appearing and in no acute distress. Eyes: Conjunctivae are normal. PER EOMI. Head: Atraumatic except for lump on the occiput where she hit her head. Nose: No congestion/rhinnorhea. Mouth/Throat: Mucous membranes are moist.  Oropharynx non-erythematous. Neck: No stridor.  No cervical spine tenderness to palpation. Cardiovascular: Normal rate, regular rhythm. Grossly normal heart sounds.  Good peripheral circulation. Respiratory: Normal respiratory effort.  No retractions. Lungs CTAB. Gastrointestinal: Soft and nontender. No distention. No abdominal bruits.  Musculoskeletal: No lower extremity tenderness nor edema.  No joint effusions. Neurologic:  Normal speech and language. No gross focal neurologic deficits are appreciated. No gait instability. Skin:  Skin is warm, dry and intact. No rash noted.   ____________________________________________   LABS (all labs ordered are listed, but only abnormal results are displayed)  Labs Reviewed  BASIC METABOLIC PANEL - Abnormal; Notable for the following components:      Result Value   Glucose, Bld 100 (*)    BUN 26 (*)    All other components within normal limits  CBG MONITORING, ED - Abnormal; Notable for the following components:   Glucose-Capillary 113 (*)    All other components within normal limits  RESP PANEL BY RT-PCR (FLU A&B, COVID) ARPGX2  CBC  URINALYSIS, COMPLETE (UACMP) WITH MICROSCOPIC  TROPONIN I (HIGH SENSITIVITY)   ____________________________________________  EKG  EKG read interpreted by me shows normal sinus rhythm rate of 87 normal axis first-degree AV block and right bundle branch block ____________________________________________  RADIOLOGY Gertha Calkin, personally viewed and evaluated  these images (plain radiographs) as part of my medical decision making, as well as reviewing the written report by the radiologist.  ED MD interpretation: CT of the head and neck and chest x-ray read by radiology reviewed by me do not show any acute changes except for the small hematoma.  Official radiology report(s): CT Head Wo Contrast  Result Date: 06/05/2021 CLINICAL DATA:  Syncopal episode today, multiple episodes recently, on heart monitor which shows complete heart block and Mobitz 2, head trauma EXAM: CT HEAD WITHOUT CONTRAST TECHNIQUE: Contiguous axial images were obtained from the base of the skull through the vertex without intravenous contrast. Sagittal and coronal MPR images reconstructed from axial data set. COMPARISON:  MR brain 05/06/2021 FINDINGS: Brain: Normal ventricular morphology. Mild generalized atrophy. No midline shift or mass effect. Probable mild small vessel chronic ischemic changes of deep cerebral white matter. No intracranial hemorrhage, mass lesion, or evidence of acute infarction. No extra-axial fluid  collection. Vascular: Mild atherosclerotic calcification of internal carotid arteries bilaterally at skull base Skull: Intact.  Small posterior LEFT parietal scalp hematoma noted. Sinuses/Orbits: Paranasal sinuses, mastoid air cells and middle ear cavities clear Other: N/A IMPRESSION: Atrophy with mild small vessel chronic ischemic changes of deep cerebral white matter. No acute intracranial abnormalities. Small posterior LEFT parietal scalp hematoma. Electronically Signed   By: Lavonia Dana M.D.   On: 06/05/2021 13:44   CT Cervical Spine Wo Contrast  Result Date: 06/05/2021 CLINICAL DATA:  Syncopal episode today, neck trauma EXAM: CT CERVICAL SPINE WITHOUT CONTRAST TECHNIQUE: Multidetector CT imaging of the cervical spine was performed without intravenous contrast. Multiplanar CT image reconstructions were also generated. COMPARISON:  None FINDINGS: Alignment: Normal Skull  base and vertebrae: Osseous demineralization. Vertebral body heights maintained. Facet alignments normal. Minimal disc space narrowing and C6-C7. Remaining disc space heights maintained. Skull base intact. No fracture, subluxation, or bone destruction. Soft tissues and spinal canal: Prevertebral soft tissues normal thickness. Scattered atherosclerotic calcifications. Disc levels:  No specific abnormalities Upper chest: Minimal biapical scarring, lung apices otherwise clear Other: N/A IMPRESSION: Osseous demineralization with minimal degenerative disc disease changes at C6-C7. No acute cervical spine abnormalities. Electronically Signed   By: Lavonia Dana M.D.   On: 06/05/2021 13:48   DG Chest Portable 1 View  Result Date: 06/05/2021 CLINICAL DATA:  Syncope today. EXAM: PORTABLE CHEST 1 VIEW COMPARISON:  None. FINDINGS: The heart size and mediastinal contours are within normal limits. Both lungs are clear. The visualized skeletal structures are unremarkable. IMPRESSION: No active disease. Electronically Signed   By: Abelardo Diesel M.D.   On: 06/05/2021 13:24    ____________________________________________   PROCEDURES  Procedure(s) performed (including Critical Care):  Procedures   ____________________________________________   INITIAL IMPRESSION / ASSESSMENT AND PLAN / ED COURSE  Patient's studies look okay.  Her recently completed long-term monitor does show some third-degree AV block and apparently some pauses as well.  Patient will likely need a pacemaker placed I talked her about this she is okay with that I do not see any other etiology for her syncope and no apparent injury from that.              ____________________________________________   FINAL CLINICAL IMPRESSION(S) / ED DIAGNOSES  Final diagnoses:  Syncope and collapse     ED Discharge Orders     None        Note:  This document was prepared using Dragon voice recognition software and may include  unintentional dictation errors.   Nena Polio, MD 06/05/21 1353 [place] patient with hospitalist who asked me to call cardiology and make sure cardiologist felt it was okay to keep her here I have done this.  Read the monitor report really describe what happened with the patient.  Cardiologist Dr. Garen Lah is fine with it.   Nena Polio, MD 06/05/21 [phone]

## 2021-06-05 NOTE — ED Notes (Signed)
Patient transported to CT 

## 2021-06-05 NOTE — ED Triage Notes (Signed)
Pt c/o syncope today, states she has had mutiple episodes recently and is on a heart monitor, report from yesterday shows periods of complete heart block and mobitz II

## 2021-06-05 NOTE — ED Notes (Signed)
Zoll at bedside, not attached to patient.

## 2021-06-05 NOTE — Progress Notes (Signed)
[facility] Progress Note Patient Name: Lori Rosario DOB: [birth year] MRN: [id number]   Date of Service  06/05/2021  HPI/Events of Note  80 yr old female with hx of cardiomyopathy admitted with syncope and fall.  Evaluation ongoing.  Stable from hemodynamic and resp standpoint.  eICU Interventions  Chart reviewed        Mauri Brooklyn, Mamie Nick 06/05/2021, 10:12 PM

## 2021-06-05 NOTE — Consult Note (Signed)
Cardiology Consultation:   Patient ID: Lori Rosario MRN: [id number]; DOB: 1941-04-07  Admit date: 06/05/2021 Date of Consult: 06/05/2021  PCP:  Sofie Hartigan, MD   [place] Providers Cardiologist:  Nelva Bush, MD        Patient Profile:   Lori Rosario is a 80 y.o. female with a hx of nonobstructive CAD, Takotsubo cardiomyopathy who is being seen 06/05/2021 for the evaluation of syncope at the request of Dr. Blaine Hamper.  History of Present Illness:   Lori Rosario is an 80 year old female with history of nonobstructive CAD (60% pLAD), Takotsubo cardiomyopathy, last EF normal, presenting with syncope.  Patient was at home in her kitchen trying to make a snack when she suddenly lost consciousness, fell to the ground hitting the back of her head.  Her daughter states patient was down for couple of minutes.  She has been dealing with dizziness ongoing the past 2 months.  Had a cardiac monitor placed 05/13/2021, resulted yesterday showing high degree AV block.  She was called by the office and advised to come to the ED if she develops any symptoms or syncope.  She was brought to the ED by daughter.  She denies chest pain or shortness of breath.  Does not take any beta-blockers or AV nodal agents.  Lipitor was stopped by neurology pending an EEG next week.  In the ED, EKG showed sinus rhythm with first-degree AV block, right bundle branch block, heart rate 87.  Troponin was normal, head CT with no acute intracranial abnormalities.   Past Medical History:  Diagnosis Date   CAD (coronary artery disease)    HFrEF (heart failure with reduced ejection fraction) ([place])    Hyperlipidemia LDL goal <70    Takotsubo cardiomyopathy    Thyroid disease     Past Surgical History:  Procedure Laterality Date   COLONOSCOPY  2013   cleared for 10 yrs- Dr @ [place] CATH AND CORONARY ANGIOGRAPHY N/A 03/04/2021   Procedure: LEFT HEART CATH AND CORONARY  ANGIOGRAPHY;  Surgeon: Nelva Bush, MD;  Location: [place] [facility];  Service: Cardiovascular;  Laterality: N/A;     Home Medications:  Prior to Admission medications   Medication Sig Start Date End Date Taking? Authorizing Provider  aspirin EC 81 MG EC tablet Take 1 tablet (81 mg total) by mouth daily. Swallow whole. 03/06/21   Loletha Grayer, MD  atorvastatin (LIPITOR) 40 MG tablet Take 1 tablet (40 mg total) by mouth daily. 03/06/21   Loletha Grayer, MD  calcium citrate-vitamin D (CITRACAL+D) 315-200 MG-UNIT tablet Take 1 tablet by mouth daily.    [provider]  furosemide (LASIX) 20 MG tablet Take 1 tablet (20 mg total) by mouth daily as needed for edema (or shortness of breath). 03/05/21   Loletha Grayer, MD  meclizine (ANTIVERT) 25 MG tablet Take 1 tablet (25 mg total) by mouth 3 (three) times daily as needed for dizziness. 05/06/21   Lucrezia Starch, MD  Multiple Vitamins-Minerals (CENTRUM SILVER 50+WOMEN PO) Take 1 tablet by mouth daily.    [provider]  SYNTHROID 137 MCG tablet Take one-half tablet at bedtime. 02/22/21   [provider]  vitamin E 180 MG (400 UNITS) capsule Take by mouth daily.    [provider]    Inpatient Medications: Scheduled Meds:  Continuous Infusions:  PRN Meds: acetaminophen (TYLENOL) oral liquid 160 mg/5 mL, atropine, hydrALAZINE, ondansetron (ZOFRAN) IV  Allergies:  No Known Allergies  Social History:   Social History   Socioeconomic History   Marital status: Married    Spouse name: Not on file   Number of children: Not on file   Years of education: Not on file   Highest education level: Not on file  Occupational History   Not on file  Tobacco Use   Smoking status: Never   Smokeless tobacco: Never  Vaping Use   Vaping Use: Never used  Substance and Sexual Activity   Alcohol use: No    Alcohol/week: 0.0 standard drinks   Drug use: No   Sexual activity: Not Currently  Other Topics  Concern   Not on file  Social History Narrative   Not on file   Social Determinants of Health   Financial Resource Strain: Not on file  Food Insecurity: Not on file  Transportation Needs: Not on file  Physical Activity: Not on file  Stress: Not on file  Social Connections: Not on file  Intimate Partner Violence: Not on file    Family History:    Family History  Problem Relation Age of Onset   Heart disease Father      ROS:  Please see the history of present illness.   All other ROS reviewed and negative.     Physical Exam/Data:   Vitals:   06/05/21 1233 06/05/21 1235 06/05/21 1237 06/05/21 1348  BP:  137/67  (!) 150/67  Pulse:  88  84  Resp:  16  10  Temp:   98.3 F (36.8 C)   TempSrc:   Oral   SpO2:  96%  95%  Weight: 54.4 kg     Height: 5\' 2"  (1.575 m)      No intake or output data in the 24 hours ending 06/05/21 1520 Last 3 Weights 06/05/2021 05/13/2021 04/06/2021  Weight (lbs) 120 lb 120 lb 122 lb 6 oz  Weight (kg) 54.432 kg 54.432 kg 55.509 kg     Body mass index is 21.95 kg/m.  General:  Well nourished, well developed, in no acute distress HEENT: normal Neck: no JVD Vascular: No carotid bruits; Distal pulses 2+ bilaterally Cardiac:  normal S1, S2; RRR; no murmur  Lungs:  clear to auscultation bilaterally, no wheezing, rhonchi or rales  Abd: soft, nontender, no hepatomegaly  Ext: no edema Musculoskeletal:  No deformities, BUE and BLE strength normal and equal Skin: warm and dry  Neuro:  CNs 2-12 intact, no focal abnormalities noted Psych:  Normal affect   EKG:  The EKG was personally reviewed and demonstrates: Sinus rhythm, first-degree AV block, right bundle branch block. Telemetry:  Telemetry was personally reviewed and demonstrates: Sinus rhythm  Relevant CV Studies:  Limited TTE 05/13/2021 1. Left ventricular ejection fraction, by estimation, is 55 to 60%. The  left ventricle has normal function. The left ventricle has no regional  wall motion  abnormalities.   2. Right ventricular systolic function is normal. The right ventricular  size is normal.   3. The aortic valve was not well visualized. Aortic valve regurgitation  is not visualized.   LHC 02/2021 Conclusions: Moderate single-vessel coronary artery disease with 50 to 60% proximal LAD stenosis. Severely reduced LVEF with mid and apical hypokinesis/akinesis and hyperdynamic basilar contraction.  Findings are consistent with stress-induced cardiomyopathy. Moderately-severely elevated left ventricular filling pressure (LVEDP ~35 mmHg).    Laboratory Data:  High Sensitivity Troponin:   Recent Labs  Lab 06/05/21 1241 06/05/21 1430  TROPONINIHS 9 11  Chemistry Recent Labs  Lab 06/05/21 1241  NA 138  K 4.2  CL 102  CO2 30  GLUCOSE 100*  BUN 26*  CREATININE 0.70  CALCIUM 9.3  GFRNONAA >60  ANIONGAP 6    No results for input(s): PROT, ALBUMIN, AST, ALT, ALKPHOS, BILITOT in the last 168 hours. Lipids No results for input(s): CHOL, TRIG, HDL, LABVLDL, LDLCALC, CHOLHDL in the last 168 hours.  Hematology Recent Labs  Lab 06/05/21 1241  WBC 6.4  RBC 4.80  HGB 15.0  HCT 43.6  MCV 90.8  MCH 31.3  MCHC 34.4  RDW 13.7  PLT 259   Thyroid No results for input(s): TSH, FREET4 in the last 168 hours.  BNPNo results for input(s): BNP, PROBNP in the last 168 hours.  DDimer No results for input(s): DDIMER in the last 168 hours.   Radiology/Studies:  CT Head Wo Contrast  Result Date: 06/05/2021 CLINICAL DATA:  Syncopal episode today, multiple episodes recently, on heart monitor which shows complete heart block and Mobitz 2, head trauma EXAM: CT HEAD WITHOUT CONTRAST TECHNIQUE: Contiguous axial images were obtained from the base of the skull through the vertex without intravenous contrast. Sagittal and coronal MPR images reconstructed from axial data set. COMPARISON:  MR brain 05/06/2021 FINDINGS: Brain: Normal ventricular morphology. Mild generalized atrophy. No  midline shift or mass effect. Probable mild small vessel chronic ischemic changes of deep cerebral white matter. No intracranial hemorrhage, mass lesion, or evidence of acute infarction. No extra-axial fluid collection. Vascular: Mild atherosclerotic calcification of internal carotid arteries bilaterally at skull base Skull: Intact.  Small posterior LEFT parietal scalp hematoma noted. Sinuses/Orbits: Paranasal sinuses, mastoid air cells and middle ear cavities clear Other: N/A IMPRESSION: Atrophy with mild small vessel chronic ischemic changes of deep cerebral white matter. No acute intracranial abnormalities. Small posterior LEFT parietal scalp hematoma. Electronically Signed   By: Lavonia Dana M.D.   On: 06/05/2021 13:44   CT Cervical Spine Wo Contrast  Result Date: 06/05/2021 CLINICAL DATA:  Syncopal episode today, neck trauma EXAM: CT CERVICAL SPINE WITHOUT CONTRAST TECHNIQUE: Multidetector CT imaging of the cervical spine was performed without intravenous contrast. Multiplanar CT image reconstructions were also generated. COMPARISON:  None FINDINGS: Alignment: Normal Skull base and vertebrae: Osseous demineralization. Vertebral body heights maintained. Facet alignments normal. Minimal disc space narrowing and C6-C7. Remaining disc space heights maintained. Skull base intact. No fracture, subluxation, or bone destruction. Soft tissues and spinal canal: Prevertebral soft tissues normal thickness. Scattered atherosclerotic calcifications. Disc levels:  No specific abnormalities Upper chest: Minimal biapical scarring, lung apices otherwise clear Other: N/A IMPRESSION: Osseous demineralization with minimal degenerative disc disease changes at C6-C7. No acute cervical spine abnormalities. Electronically Signed   By: Lavonia Dana M.D.   On: 06/05/2021 13:48   DG Chest Portable 1 View  Result Date: 06/05/2021 CLINICAL DATA:  Syncope today. EXAM: PORTABLE CHEST 1 VIEW COMPARISON:  None. FINDINGS: The heart size  and mediastinal contours are within normal limits. Both lungs are clear. The visualized skeletal structures are unremarkable. IMPRESSION: No active disease. Electronically Signed   By: Abelardo Diesel M.D.   On: 06/05/2021 13:24     Assessment and Plan:   Syncope, high degree AV block -Cardiac monitor reviewed by myself showing second and episodes of third-degree AV block. -Last echo 03/2021 with normal EF -Currently in sinus rhythm, heart rate in the 80s, clinically asymptomatic. -Continue to avoid AV nodal agents -Consult EP in the a.m. for pacemaker consideration -Consider dopamine drip for  heart rates less than 40.  Heart rate is currently normal.  2.  Nonobstructive CAD -Continue aspirin 81mg  -Restart Lipitor after neuro/EEG testing  Total encounter time 110 minutes  Greater than 50% was spent in counseling and coordination of care with the patient   Signed, Kate Sable, MD  06/05/2021 3:20 PM

## 2021-06-05 NOTE — ED Notes (Signed)
Bedside alarm ringing out, went to find patient sitting up, eating denies any dizziness, chest pain or shortness of breath. Patient had two significant pauses captured on bedside monitor. Zoll pads placed on patient ,explained necessity of pads. Patient voiced understanding.

## 2021-06-06 ENCOUNTER — Observation Stay (HOSPITAL_COMMUNITY)
Admission: RE | Admit: 2021-06-06 | Discharge: 2021-06-07 | Disposition: A | Discharge status: Home / Self Care | Payer: PPO | Attending: Cardiology | Admitting: Cardiology

## 2021-06-06 ENCOUNTER — Ambulatory Visit (HOSPITAL_COMMUNITY): Admit: 2021-06-06 | Payer: PPO | Admitting: Cardiology

## 2021-06-06 ENCOUNTER — Encounter (HOSPITAL_COMMUNITY)
Admission: RE | Disposition: A | Discharge status: Home / Self Care | Payer: Self-pay | Source: Home / Self Care | Attending: Cardiology

## 2021-06-06 ENCOUNTER — Inpatient Hospital Stay
Admission: AD | Admit: 2021-06-06 | Payer: PPO | Source: Other Acute Inpatient Hospital | Admitting: Cardiovascular Disease

## 2021-06-06 ENCOUNTER — Encounter (HOSPITAL_COMMUNITY): Payer: Self-pay | Admitting: Cardiology

## 2021-06-06 ENCOUNTER — Ambulatory Visit (HOSPITAL_COMMUNITY): Payer: Self-pay

## 2021-06-06 DIAGNOSIS — I5032 Chronic diastolic (congestive) heart failure: Secondary | ICD-10-CM

## 2021-06-06 DIAGNOSIS — I2510 Atherosclerotic heart disease of native coronary artery without angina pectoris: Secondary | ICD-10-CM | POA: Diagnosis not present

## 2021-06-06 DIAGNOSIS — I442 Atrioventricular block, complete: Secondary | ICD-10-CM | POA: Diagnosis not present

## 2021-06-06 DIAGNOSIS — E039 Hypothyroidism, unspecified: Secondary | ICD-10-CM | POA: Insufficient documentation

## 2021-06-06 DIAGNOSIS — Z950 Presence of cardiac pacemaker: Principal | ICD-10-CM

## 2021-06-06 DIAGNOSIS — I5030 Unspecified diastolic (congestive) heart failure: Secondary | ICD-10-CM | POA: Insufficient documentation

## 2021-06-06 DIAGNOSIS — Z7982 Long term (current) use of aspirin: Secondary | ICD-10-CM | POA: Diagnosis not present

## 2021-06-06 DIAGNOSIS — Z79899 Other long term (current) drug therapy: Secondary | ICD-10-CM | POA: Insufficient documentation

## 2021-06-06 HISTORY — PX: PACEMAKER IMPLANT: EP1218

## 2021-06-06 LAB — MAGNESIUM: Magnesium: 2.4 mg/dL (ref 1.7–2.4)

## 2021-06-06 LAB — CBC
HCT: 44.7 % (ref 36.0–46.0)
Hemoglobin: 14.9 g/dL (ref 12.0–15.0)
MCH: 29.6 pg (ref 26.0–34.0)
MCHC: 33.3 g/dL (ref 30.0–36.0)
MCV: 88.9 fL (ref 80.0–100.0)
Platelets: 259 10*3/uL (ref 150–400)
RBC: 5.03 MIL/uL (ref 3.87–5.11)
RDW: 14 % (ref 11.5–15.5)
WBC: 4.7 10*3/uL (ref 4.0–10.5)
nRBC: 0 % (ref 0.0–0.2)

## 2021-06-06 LAB — BASIC METABOLIC PANEL
Anion gap: 5 (ref 5–15)
BUN: 21 mg/dL (ref 8–23)
CO2: 30 mmol/L (ref 22–32)
Calcium: 8.8 mg/dL — ABNORMAL LOW (ref 8.9–10.3)
Chloride: 104 mmol/L (ref 98–111)
Creatinine, Ser: 0.64 mg/dL (ref 0.44–1.00)
GFR, Estimated: >60 mL/min (ref >60)
Glucose, Bld: 86 mg/dL (ref 70–99)
Potassium: 3.9 mmol/L (ref 3.5–5.1)
Sodium: 139 mmol/L (ref 135–145)

## 2021-06-06 LAB — PHOSPHORUS: Phosphorus: 4.2 mg/dL (ref 2.5–4.6)

## 2021-06-06 SURGERY — PACEMAKER IMPLANT

## 2021-06-06 MED ORDER — LIDOCAINE HCL (PF) 1 % IJ SOLN
INTRAMUSCULAR | Status: DC | PRN
  Administered 2021-06-06: 45 mL

## 2021-06-06 MED ORDER — HEPARIN (PORCINE) IN NACL 1000-0.9 UT/500ML-% IV SOLN
INTRAVENOUS | Status: DC | PRN
  Administered 2021-06-06: 500 mL

## 2021-06-06 MED ORDER — ATROPINE SULFATE 1 MG/10ML IJ SOSY: 1.0000 mg | PREFILLED_SYRINGE | Freq: Every day | INTRAMUSCULAR | Status: DC | PRN

## 2021-06-06 MED ORDER — ATORVASTATIN CALCIUM 40 MG PO TABS
40.0000 mg | ORAL_TABLET | Freq: Every day | ORAL | Status: DC
  Filled 2021-06-06: qty 1

## 2021-06-06 MED ORDER — CHLORHEXIDINE GLUCONATE 4 % EX LIQD
60.0000 mL | Freq: Once | CUTANEOUS | Status: DC
  Filled 2021-06-06: qty 60

## 2021-06-06 MED ORDER — ACETAMINOPHEN 325 MG PO TABS: 325.0000 mg | ORAL_TABLET | ORAL | Status: DC | PRN

## 2021-06-06 MED ORDER — SODIUM CHLORIDE 0.9 % IV SOLN
250.0000 mL | INTRAVENOUS | Status: DC
  Administered 2021-06-06: 250 mL via INTRAVENOUS

## 2021-06-06 MED ORDER — CEFAZOLIN SODIUM-DEXTROSE 2-4 GM/100ML-% IV SOLN
2.0000 g | INTRAVENOUS | Status: AC
  Administered 2021-06-06: 2 g via INTRAVENOUS

## 2021-06-06 MED ORDER — HEPARIN (PORCINE) IN NACL 1000-0.9 UT/500ML-% IV SOLN
INTRAVENOUS | Status: AC
  Filled 2021-06-06: qty 500

## 2021-06-06 MED ORDER — CHLORHEXIDINE GLUCONATE CLOTH 2 % EX PADS: 6.0000 | MEDICATED_PAD | Freq: Every day | CUTANEOUS | Status: DC

## 2021-06-06 MED ORDER — ONDANSETRON HCL 4 MG/2ML IJ SOLN: 4.0000 mg | Freq: Four times a day (QID) | INTRAMUSCULAR | Status: DC | PRN

## 2021-06-06 MED ORDER — ERYTHROMYCIN 5 MG/GM OP OINT
1.0000 "application " | TOPICAL_OINTMENT | Freq: Every day | OPHTHALMIC | Status: DC
  Filled 2021-06-06: qty 3.5

## 2021-06-06 MED ORDER — SODIUM CHLORIDE 0.9% FLUSH: 3.0000 mL | INTRAVENOUS | Status: DC | PRN

## 2021-06-06 MED ORDER — LEVOTHYROXINE SODIUM 25 MCG PO TABS
68.5000 ug | ORAL_TABLET | Freq: Every day | ORAL | Status: DC
  Administered 2021-06-07: 68.5 ug via ORAL
  Filled 2021-06-06: qty 1

## 2021-06-06 MED ORDER — GATIFLOXACIN 0.5 % OP SOLN
1.0000 [drp] | Freq: Four times a day (QID) | OPHTHALMIC | Status: DC
  Filled 2021-06-06: qty 2.5

## 2021-06-06 MED ORDER — SODIUM CHLORIDE 0.9 % IV SOLN
INTRAVENOUS | Status: AC
  Filled 2021-06-06: qty 2

## 2021-06-06 MED ORDER — SODIUM CHLORIDE 0.9 % IV SOLN
80.0000 mg | INTRAVENOUS | Status: AC
  Administered 2021-06-06: 80 mg

## 2021-06-06 MED ORDER — SODIUM CHLORIDE 0.9% FLUSH
3.0000 mL | Freq: Two times a day (BID) | INTRAVENOUS | Status: DC
  Administered 2021-06-06 (×2): 3 mL via INTRAVENOUS

## 2021-06-06 MED ORDER — CHLORHEXIDINE GLUCONATE 4 % EX LIQD: 60.0000 mL | Freq: Once | CUTANEOUS | Status: DC

## 2021-06-06 MED ORDER — CEFAZOLIN SODIUM-DEXTROSE 2-4 GM/100ML-% IV SOLN
INTRAVENOUS | Status: AC
  Filled 2021-06-06: qty 100

## 2021-06-06 MED ORDER — LIDOCAINE HCL 1 % IJ SOLN
INTRAMUSCULAR | Status: AC
  Filled 2021-06-06: qty 60

## 2021-06-06 MED ORDER — SODIUM CHLORIDE 0.9 % IV SOLN
INTRAVENOUS | Status: DC
Start: 2021-06-06 — End: 2021-06-07

## 2021-06-06 SURGICAL SUPPLY — 11 items
CABLE SURGICAL S-101-97-12 (CABLE) ×2 IMPLANT
KIT ACCESSORY SELECTRA FIX CVD (MISCELLANEOUS) ×1 IMPLANT
LEAD SELECTRA 3D-55-42 (CATHETERS) ×1 IMPLANT
LEAD SOLIA S PRO MRI 53 (Lead) ×1 IMPLANT
LEAD SOLIA S PRO MRI 60 (Lead) ×1 IMPLANT
PACEMAKER EDORA 8DR-T MRI (Pacemaker) ×1 IMPLANT
PAD PRO RADIOLUCENT 2001M-C (PAD) ×2 IMPLANT
SHEATH 7FR PRELUDE SNAP 13 (SHEATH) ×1 IMPLANT
SHEATH 9FR PRELUDE SNAP 13 (SHEATH) ×1 IMPLANT
TRAY PACEMAKER INSERTION (PACKS) ×2 IMPLANT
WIRE HI TORQ VERSACORE-J 145CM (WIRE) ×1 IMPLANT

## 2021-06-06 NOTE — H&P (Signed)
H&P   Patient ID: [name] MRN: [id number]; DOB: Oct 18, 1940  Admit date: 06/06/2021 Date of Consult: 06/06/2021  PCP:  Lori Hartigan, MD   Lori Rosario Cardiologist:  Lori Bush, MD   {   Patient Profile:   Lori Rosario is a 80 y.o. female with a hx of Hyperthyroidism >> RAI >> hypothyroidism, NICM with LVEF recovery who is being seen 06/06/2021 for the evaluation of syncope and CHB at the request of Lori Rosario.  History of Present Illness:   Lori Rosario was 1st brought to cardiology attention July 2022 when she was admitted with CP, no historical cardiac data to compare, she had RBBB on her EKG and concerns of STEMI, she was brought urgently for cath that noted no obstructive CAD  03/04/21: [facility] FINDINGS: Non-obstructive CAD with up to 50-60% stenosis involving the proximal LAD. Severely reduced left ventricular systolic function with mid/apical hypo/akinesis consistent with Takotsubo cardiomyopathy. Moderately to severely elevated left ventricular filling pressure. TTE noted LVEF 25-30%  Felt to be stress induced CM after the death of her husband  She was started on GDMT to her BP tolerance and discharged 03/05/21.  She followed with the cardiology team and did have good LVEF recovery by her 04/20/21 echo to 55-60% She mentioned episodes of dizziness, prompting reduction in metoprolol andf inally al together was stopped,  and addition of meclizine with her medicine team.   She also saw ENT and symptoms felt c/w vertigo  She was admitted to [facility] yesterday after a syncopal event, cardiology consult reported "was at home in her kitchen trying to make a snack when she suddenly lost consciousness, fell to the ground hitting the back of her head.  Her daughter states patient was down for couple of minutes." Unchanged EKG with baseline RBBB/1st degree AVBlock, CT head no acute findings. On telemetry however had episodes of advanced heart block including  CHB.  Planned to transfer to [facility] for EP to see and plan PPM with no reversible causes identified  LABS K+ 4.2 > 3.9 Mag 2.7 BUN/Creat 20/0.70 > 21/0.64 WBC 6.4 > 4.7 H/H 14/44 Plts 259 HS Trop 9, 11 BNP 80.7  She has not had any CP, palpitations, even here has recurrent very brief dizzy feelings associated with short block episodes    Past Medical History:  Diagnosis Date   CAD (coronary artery disease)    HFrEF (heart failure with reduced ejection fraction) ([place])    Hyperlipidemia LDL goal <70    Takotsubo cardiomyopathy    Thyroid disease     Past Surgical History:  Procedure Laterality Date   COLONOSCOPY  2013   cleared for 10 yrs- Dr @ [place] CATH AND CORONARY ANGIOGRAPHY N/A 03/04/2021   Procedure: LEFT HEART CATH AND CORONARY ANGIOGRAPHY;  Surgeon: Lori Bush, MD;  Location: [facility];  Service: Cardiovascular;  Laterality: N/A;     Home Medications:  Prior to Admission medications   Medication Sig Start Date End Date Taking? Authorizing Provider  aspirin EC 81 MG EC tablet Take 1 tablet (81 mg total) by mouth daily. Swallow whole. 03/06/21   Loletha Grayer, MD  atorvastatin (LIPITOR) 40 MG tablet Take 1 tablet (40 mg total) by mouth daily. Patient not taking: Reported on 06/05/2021 03/06/21   Loletha Grayer, MD  erythromycin ophthalmic ointment Place 1 application into the right eye at bedtime. 06/02/21 06/09/21  [provider]  furosemide (  LASIX) 20 MG tablet Take 1 tablet (20 mg total) by mouth daily as needed for edema (or shortness of breath). Patient not taking: Reported on 06/05/2021 03/05/21   Loletha Grayer, MD  gatifloxacin (ZYMAXID) 0.5 % SOLN Place 1 drop into the right eye 4 (four) times daily.    [provider]  meclizine (ANTIVERT) 25 MG tablet Take 1 tablet (25 mg total) by mouth 3 (three) times daily as needed for dizziness. Patient not taking: Reported on 06/05/2021 05/06/21    Lucrezia Starch, MD  SYNTHROID 137 MCG tablet Take 68.5 mcg by mouth daily.    [provider]    Inpatient Medications: Scheduled Meds:  Continuous Infusions:  PRN Meds:   Allergies:   No Known Allergies  Social History:   Social History   Socioeconomic History   Marital status: Married    Spouse name: Not on file   Number of children: Not on file   Years of education: Not on file   Highest education level: Not on file  Occupational History   Not on file  Tobacco Use   Smoking status: Never   Smokeless tobacco: Never  Vaping Use   Vaping Use: Never used  Substance and Sexual Activity   Alcohol use: No    Alcohol/week: 0.0 standard drinks   Drug use: No   Sexual activity: Not Currently  Other Topics Concern   Not on file  Social History Narrative   Not on file   Social Determinants of Health   Financial Resource Strain: Not on file  Food Insecurity: Not on file  Transportation Needs: Not on file  Physical Activity: Not on file  Stress: Not on file  Social Connections: Not on file  Intimate Partner Violence: Not on file    Family History:   Family History  Problem Relation Age of Onset   Heart disease Father      ROS:  Please see the history of present illness.  All other ROS reviewed and negative.     Physical Exam/Data:  There were no vitals filed for this visit. No intake or output data in the 24 hours ending 06/06/21 1342 Last 3 Weights 06/05/2021 06/05/2021 05/13/2021  Weight (lbs) 122 lb 9.2 oz 120 lb 120 lb  Weight (kg) 55.6 kg 54.432 kg 54.432 kg     There is no height or weight on file to calculate BMI.  General:  Well nourished, well developed, in no acute distress HEENT: normal Neck: no JVD Vascular: No carotid bruits; Distal pulses 2+ bilaterally Cardiac:  RRR; no murmurs, gallops or rubs Lungs: CTA b/l, no wheezing, rhonchi or rales  Abd: soft, nontender, no hepatomegaly  Ext: no edema Musculoskeletal:  No deformities, BUE  and BLE strength normal and equal Skin: warm and dry  Neuro:  No focal abnormalities noted Psych:  Normal affect   EKG:  The EKG was personally reviewed and demonstrates:   SR 87bpm, RBBB, 1st degree AVblock, 29ms  Telemetry:  Telemetry was personally reviewed and demonstrates:   Full telemetry not available Though saved CV strips are reviewed noting CHB with V pasing 4.6 seconds, longest pause save (at 17:43 yesterday, presumably awake)  Relevant CV Studies:  04/20/21: TTE IMPRESSIONS   1. Left ventricular ejection fraction, by estimation, is 55 to 60%. The  left ventricle has normal function. The left ventricle has no regional  wall motion abnormalities.   2. Right ventricular systolic function is normal. The right ventricular  size is normal.  3. The aortic valve was not well visualized. Aortic valve regurgitation  is not visualized.    03/05/21: TTE IMPRESSIONS  1. Findings consistent with Takatsubo stress induced DCM. Akinesis of mid  and apical walls with hyperdyanamic basal function No mural apical  thrombus with or without definity . Left ventricular ejection fraction, by  estimation, is 25 to 30%. The left  ventricle has severely decreased function. The left ventricle has no  regional wall motion abnormalities. Left ventricular diastolic parameters  are consistent with Grade I diastolic dysfunction (impaired relaxation).   2. Right ventricular systolic function is normal. The right ventricular  size is normal.   3. The pericardial effusion is surrounding the apex and anterior to the  right ventricle.   4. The mitral valve is abnormal. Trivial mitral valve regurgitation. No  evidence of mitral stenosis.   5. The aortic valve is tricuspid. There is mild calcification of the  aortic valve. Aortic valve regurgitation is not visualized. Mild aortic  valve sclerosis is present, with no evidence of aortic valve stenosis.   6. The inferior vena cava is normal in size with  greater than 50%  respiratory variability, suggesting right atrial pressure of 3 mmHg.   03/04/21: LHC FINDINGS: Non-obstructive CAD with up to 50-60% stenosis involving the proximal LAD. Severely reduced left ventricular systolic function with mid/apical hypo/akinesis consistent with Takotsubo cardiomyopathy. Moderately to severely elevated left ventricular filling pressure.  Laboratory Data:  High Sensitivity Troponin:   Recent Labs  Lab 06/05/21 1241 06/05/21 1430  TROPONINIHS 9 11      Chemistry Recent Labs  Lab 06/05/21 1241 06/05/21 1457 06/06/21 0622  NA 138  --  139  K 4.2  --  3.9  CL 102  --  104  CO2 30  --  30  GLUCOSE 100*  --  86  BUN 26*  --  21  CREATININE 0.70  --  0.64  CALCIUM 9.3  --  8.8*  MG  --  2.7* 2.4  GFRNONAA >60  --  >60  ANIONGAP 6  --  5     No results for input(s): PROT, ALBUMIN, AST, ALT, ALKPHOS, BILITOT in the last 168 hours. Lipids No results for input(s): CHOL, TRIG, HDL, LABVLDL, LDLCALC, CHOLHDL in the last 168 hours.  Hematology Recent Labs  Lab 06/05/21 1241 06/06/21 0622  WBC 6.4 4.7  RBC 4.80 5.03  HGB 15.0 14.9  HCT 43.6 44.7  MCV 90.8 88.9  MCH 31.3 29.6  MCHC 34.4 33.3  RDW 13.7 14.0  PLT 259 259    Thyroid No results for input(s): TSH, FREET4 in the last 168 hours.  BNP Recent Labs  Lab 06/05/21 1445  BNP 80.7     DDimer No results for input(s): DDIMER in the last 168 hours.   Radiology/Studies:  CT Head Wo Contrast Result Date: 06/05/2021 CLINICAL DATA:  Syncopal episode today, multiple episodes recently, on heart monitor which shows complete heart block and Mobitz 2, head trauma EXAM: CT HEAD WITHOUT CONTRAST TECHNIQUE: Contiguous axial images were obtained from the base of the skull through the vertex without intravenous contrast. Sagittal and coronal MPR images reconstructed from axial data set. COMPARISON:  MR brain 05/06/2021 FINDINGS: Brain: Normal ventricular morphology. Mild generalized atrophy.  No midline shift or mass effect. Probable mild small vessel chronic ischemic changes of deep cerebral white matter. No intracranial hemorrhage, mass lesion, or evidence of acute infarction. No extra-axial fluid collection. Vascular: Mild atherosclerotic calcification of internal carotid  arteries bilaterally at skull base Skull: Intact.  Small posterior LEFT parietal scalp hematoma noted. Sinuses/Orbits: Paranasal sinuses, mastoid air cells and middle ear cavities clear Other: N/A IMPRESSION: Atrophy with mild small vessel chronic ischemic changes of deep cerebral white matter. No acute intracranial abnormalities. Small posterior LEFT parietal scalp hematoma. Electronically Signed   By: Lavonia Dana M.D.   On: 06/05/2021 13:44   CT Cervical Spine Wo Contrast Result Date: 06/05/2021 CLINICAL DATA:  Syncopal episode today, neck trauma EXAM: CT CERVICAL SPINE WITHOUT CONTRAST TECHNIQUE: Multidetector CT imaging of the cervical spine was performed without intravenous contrast. Multiplanar CT image reconstructions were also generated. COMPARISON:  None FINDINGS: Alignment: Normal Skull base and vertebrae: Osseous demineralization. Vertebral body heights maintained. Facet alignments normal. Minimal disc space narrowing and C6-C7. Remaining disc space heights maintained. Skull base intact. No fracture, subluxation, or bone destruction. Soft tissues and spinal canal: Prevertebral soft tissues normal thickness. Scattered atherosclerotic calcifications. Disc levels:  No specific abnormalities Upper chest: Minimal biapical scarring, lung apices otherwise clear Other: N/A IMPRESSION: Osseous demineralization with minimal degenerative disc disease changes at C6-C7. No acute cervical spine abnormalities. Electronically Signed   By: Lavonia Dana M.D.   On: 06/05/2021 13:48   DG Chest Portable 1 View Result Date: 06/05/2021 CLINICAL DATA:  Syncope today. EXAM: PORTABLE CHEST 1 VIEW COMPARISON:  None. FINDINGS: The heart size and  mediastinal contours are within normal limits. Both lungs are clear. The visualized skeletal structures are unremarkable. IMPRESSION: No active disease. Electronically Signed   By: Abelardo Diesel M.D.   On: 06/05/2021 13:24     Assessment and Plan:   Syncope Advanced heart block including CHB       Baseline conduction system disease       No reversible causes  Plan is for PPM implant Rational for PPM is discussed with the patient risks benefits discussed with the patient she is agreeable to proceed  Lori Rosario will see once out pf procedure   Risk Assessment/Risk Scores:     For questions or updates, please contact [facility] Please consult [email or web address] for contact info under    Signed, Lori Jamaica, PA-C  06/06/2021 1:42 PM

## 2021-06-06 NOTE — Progress Notes (Signed)
Pt transported to [facility] via [facility].  Pt belongings with family member prior to pt leaving.  Placed on heart monitor and pt in no distress at time of transport.  PIV remains in place during transfer.  Report and EMTALA along with all other required paperwork given to [facility].

## 2021-06-06 NOTE — Progress Notes (Signed)
Pt arrived to unit from cath lab  A/O x 4,  CCMD called ,CHG given, pt oriented to unit,Will continue to monitor.  Left side pace maker placed set DDD 60-130. Pt lives with daughter. Left chest dressing clean dry and intact   Phoebe Sharps, RN     06/06/21 1744  Vitals  Temp 98.5 F (36.9 C)  Temp Source Oral  BP (!) 160/67  MAP (mmHg) 93  BP Location Right Arm  BP Method Automatic  Patient Position (if appropriate) Lying  Pulse Rate 97  Pulse Rate Source Monitor  ECG Heart Rate 94  Resp 20  Level of Consciousness  Level of Consciousness Alert  Oxygen Therapy  SpO2 98 %  O2 Device Room Air  O2 Flow Rate (L/min) 0 L/min  ECG Monitoring  Cardiac Rhythm NSR  Pain Assessment  Pain Scale 0-10  Pain Score 0  Glasgow Coma Scale  Eye Opening 4  Best Verbal Response (NON-intubated) 5  Best Motor Response 6  Glasgow Coma Scale Score 15  MEWS Score  MEWS Temp 0  MEWS Systolic 0  MEWS Pulse 0  MEWS RR 0  MEWS LOC 0  MEWS Score 0  MEWS Score Color Green

## 2021-06-06 NOTE — Discharge Summary (Signed)
Physician Discharge Summary  Patient ID: Lori Rosario MRN: [id number] DOB/AGE: [birth year] 80 y.o.  Admit date: 06/05/2021 Discharge date: 06/06/2021  Admission Diagnoses:  Discharge Diagnoses:  Principal Problem:   Third degree AV block (HCC) Active Problems:   Hyperlipidemia   Hypothyroidism   Syncope   CAD (coronary artery disease)   Chronic diastolic CHF (congestive heart failure) [facility])   Discharged Condition: good  Hospital Course:   Lori Rosario is a 80 y.o. female with medical history significant of dCHF, hyperlipidemia, hypothyroidism, Takotsubo cardiomyopathy, CAD, STEMI, who presents with syncope. Had a cardiac monitor placed 05/13/2021, resulted yesterday showing high degree AV block, with complete heart block and bradycardia and also Mobitz type II AVB. Patient was monitored in the stepdown unit with as needed atropine, external pacemaker placement. Cardiology has arranged for patient be transferred to [facility] for pacemaker placement.  Currently she is medically stable to be transferred.  Consults: cardiology  Significant Diagnostic Studies:   Treatments: Telemetry, as needed atropine.  Discharge Exam: Blood pressure 133/67, pulse 81, temperature 98.5 F (36.9 C), temperature source Oral, resp. rate (!) 22, height 5\' 3"  (1.6 m), weight 55.6 kg, SpO2 100 %. General appearance: alert and cooperative Resp: clear to auscultation bilaterally Cardio: regular rate and rhythm, S1, S2 normal, no murmur, click, rub or gallop GI: soft, non-tender; bowel sounds normal; no masses,  no organomegaly Extremities: extremities normal, atraumatic, no cyanosis or edema  Disposition: Discharge disposition: 02-Transferred to [facility]       Discharge Instructions     Diet - low sodium heart healthy   Complete by: As directed    Increase activity slowly   Complete by: As directed       Allergies as of 06/06/2021   No Known Allergies       Medication List     TAKE these medications    aspirin 81 MG EC tablet Take 1 tablet (81 mg total) by mouth daily. Swallow whole.   atorvastatin 40 MG tablet Commonly known as: LIPITOR Take 1 tablet (40 mg total) by mouth daily.   atropine 1 MG/10ML injection Inject 10 mLs (1 mg total) into the vein daily as needed (for symptomatic bradycardia).   erythromycin ophthalmic ointment Place 1 application into the right eye at bedtime.   furosemide 20 MG tablet Commonly known as: LASIX Take 1 tablet (20 mg total) by mouth daily as needed for edema (or shortness of breath).   gatifloxacin 0.5 % Soln Commonly known as: ZYMAXID Place 1 drop into the right eye 4 (four) times daily.   meclizine 25 MG tablet Commonly known as: ANTIVERT Take 1 tablet (25 mg total) by mouth 3 (three) times daily as needed for dizziness.   Synthroid 137 MCG tablet Generic drug: levothyroxine Take 68.5 mcg by mouth daily.         Signed: Sharen Hones 06/06/2021, 9:37 AM

## 2021-06-06 NOTE — H&P (Signed)
Please see H&P from same day of service.  Tommye Standard, PA-C

## 2021-06-06 NOTE — Progress Notes (Signed)
Patient arrived to the unit A&Ox 4. VSS. Denies pain. NSR w/ BBB & HB on monitor. Will continue to monitor

## 2021-06-06 NOTE — Progress Notes (Signed)
Progress Note  Patient Name: Lori Rosario Date of Encounter: 06/06/2021  [facility] Cardiologist: Nelva Bush, MD   Subjective   The patient presented after she had a syncopal episode yesterday which resulted in head trauma.  She had CT scan of the head which showed no intracranial bleed but there was small posterior scalp hematoma.  The patient reports recent and frequent episodes of dizziness and presyncope.  She had outpatient ZIO monitor that revealed intermittent high-grade AV block with frequent pauses up to 10 seconds. She had previous Takotsubo cardiomyopathy in July of this year with an ejection fraction of 25 to 30%.  Subsequent echocardiogram in August showed normalization of LV systolic function. She feels well today with no chest pain or shortness of breath.  She is in sinus rhythm.  Inpatient Medications    Scheduled Meds:  aspirin EC  81 mg Oral Daily   Chlorhexidine Gluconate Cloth  6 each Topical Daily   erythromycin  1 application Right Eye QHS   gatifloxacin  1 drop Right Eye QID   levothyroxine  68.5 mcg Oral Daily   Continuous Infusions:  PRN Meds: acetaminophen (TYLENOL) oral liquid 160 mg/5 mL, atropine, hydrALAZINE, ondansetron (ZOFRAN) IV   Vital Signs    Vitals:   06/06/21 0600 06/06/21 0700 06/06/21 0800 06/06/21 0900  BP: (!) 111/52 (!) 121/55 130/67 133/67  Pulse: 66 81 77 81  Resp: 15 19 14  (!) 22  Temp:   98.5 F (36.9 C)   TempSrc:   Oral   SpO2: 97% 96% 97% 100%  Weight:      Height:       No intake or output data in the 24 hours ending 06/06/21 0941 Last 3 Weights 06/05/2021 06/05/2021 05/13/2021  Weight (lbs) 122 lb 9.2 oz 120 lb 120 lb  Weight (kg) 55.6 kg 54.432 kg 54.432 kg      Telemetry    Normal sinus rhythm.- Personally Reviewed  ECG    Not done today.- Personally Reviewed  Physical Exam   GEN: No acute distress.   Neck: No JVD Cardiac: RRR, no murmurs, rubs, or gallops.  Respiratory: Clear to auscultation  bilaterally. GI: Soft, nontender, non-distended  MS: No edema; No deformity. Neuro:  Nonfocal  Psych: Normal affect   Labs    High Sensitivity Troponin:   Recent Labs  Lab 06/05/21 1241 06/05/21 1430  TROPONINIHS 9 11     Chemistry Recent Labs  Lab 06/05/21 1241 06/05/21 1457 06/06/21 0622  NA 138  --  139  K 4.2  --  3.9  CL 102  --  104  CO2 30  --  30  GLUCOSE 100*  --  86  BUN 26*  --  21  CREATININE 0.70  --  0.64  CALCIUM 9.3  --  8.8*  MG  --  2.7* 2.4  GFRNONAA >60  --  >60  ANIONGAP 6  --  5    Lipids No results for input(s): CHOL, TRIG, HDL, LABVLDL, LDLCALC, CHOLHDL in the last 168 hours.  Hematology Recent Labs  Lab 06/05/21 1241 06/06/21 0622  WBC 6.4 4.7  RBC 4.80 5.03  HGB 15.0 14.9  HCT 43.6 44.7  MCV 90.8 88.9  MCH 31.3 29.6  MCHC 34.4 33.3  RDW 13.7 14.0  PLT 259 259   Thyroid No results for input(s): TSH, FREET4 in the last 168 hours.  BNP Recent Labs  Lab 06/05/21 1445  BNP 80.7    DDimer No  results for input(s): DDIMER in the last 168 hours.   Radiology    CT Head Wo Contrast  Result Date: 06/05/2021 CLINICAL DATA:  Syncopal episode today, multiple episodes recently, on heart monitor which shows complete heart block and Mobitz 2, head trauma EXAM: CT HEAD WITHOUT CONTRAST TECHNIQUE: Contiguous axial images were obtained from the base of the skull through the vertex without intravenous contrast. Sagittal and coronal MPR images reconstructed from axial data set. COMPARISON:  MR brain 05/06/2021 FINDINGS: Brain: Normal ventricular morphology. Mild generalized atrophy. No midline shift or mass effect. Probable mild small vessel chronic ischemic changes of deep cerebral white matter. No intracranial hemorrhage, mass lesion, or evidence of acute infarction. No extra-axial fluid collection. Vascular: Mild atherosclerotic calcification of internal carotid arteries bilaterally at skull base Skull: Intact.  Small posterior LEFT parietal scalp  hematoma noted. Sinuses/Orbits: Paranasal sinuses, mastoid air cells and middle ear cavities clear Other: N/A IMPRESSION: Atrophy with mild small vessel chronic ischemic changes of deep cerebral white matter. No acute intracranial abnormalities. Small posterior LEFT parietal scalp hematoma. Electronically Signed   By: Lavonia Dana M.D.   On: 06/05/2021 13:44   CT Cervical Spine Wo Contrast  Result Date: 06/05/2021 CLINICAL DATA:  Syncopal episode today, neck trauma EXAM: CT CERVICAL SPINE WITHOUT CONTRAST TECHNIQUE: Multidetector CT imaging of the cervical spine was performed without intravenous contrast. Multiplanar CT image reconstructions were also generated. COMPARISON:  None FINDINGS: Alignment: Normal Skull base and vertebrae: Osseous demineralization. Vertebral body heights maintained. Facet alignments normal. Minimal disc space narrowing and C6-C7. Remaining disc space heights maintained. Skull base intact. No fracture, subluxation, or bone destruction. Soft tissues and spinal canal: Prevertebral soft tissues normal thickness. Scattered atherosclerotic calcifications. Disc levels:  No specific abnormalities Upper chest: Minimal biapical scarring, lung apices otherwise clear Other: N/A IMPRESSION: Osseous demineralization with minimal degenerative disc disease changes at C6-C7. No acute cervical spine abnormalities. Electronically Signed   By: Lavonia Dana M.D.   On: 06/05/2021 13:48   DG Chest Portable 1 View  Result Date: 06/05/2021 CLINICAL DATA:  Syncope today. EXAM: PORTABLE CHEST 1 VIEW COMPARISON:  None. FINDINGS: The heart size and mediastinal contours are within normal limits. Both lungs are clear. The visualized skeletal structures are unremarkable. IMPRESSION: No active disease. Electronically Signed   By: Abelardo Diesel M.D.   On: 06/05/2021 13:24    Cardiac Studies   Outpatient ZIO monitor done recently showed sinus rhythm with high-grade AV block and frequent pauses.  The longest pause  was 10 seconds.  Patient Profile     80 y.o. female with history of stress-induced cardiomyopathy with subsequent normalization of LV systolic function, moderate nonobstructive coronary artery disease and hyperlipidemia who presented with syncope and recent documentation of high-grade AV block  Assessment & Plan    1.  Symptomatic high-grade AV block with syncope: I reviewed the patient's recent cardiac work-up including cardiac catheterization July, echocardiogram in August and recent ZIO monitor.  Her symptoms are due to high-grade AV block.  I recommend proceeding with permanent pacemaker placement.  I discussed the procedure in details as well as risks and benefits and the patient is agreeable to proceed.  I discussed the case with Dr. Quentin Ore over the phone who accepted the patient for transfer to to expedite and proceed with pacemaker placement later today in the afternoon.  2.  History of stress-induced cardiomyopathy: Cardiac catheterization July showed moderate nonobstructive coronary artery disease.  Subsequent echocardiogram showed normalization of LV systolic function.  No evidence of heart failure at the present time.      Total encounter time 35 minutes Greater than 50% was spent in counseling and coordination of care with the patient   For questions or updates, please contact [place] Please consult [email or web address] for contact info under        Signed, Kathlyn Sacramento, MD  06/06/2021, 9:41 AM

## 2021-06-06 NOTE — Plan of Care (Signed)
  Problem: Health Behavior/Discharge Planning: Goal: Ability to manage health-related needs will improve Outcome: Progressing   Problem: Clinical Measurements: Goal: Ability to maintain clinical measurements within normal limits will improve Outcome: Progressing   Problem: Clinical Measurements: Goal: Diagnostic test results will improve Outcome: Progressing   Problem: Clinical Measurements: Goal: Cardiovascular complication will be avoided Outcome: Progressing   Problem: Clinical Measurements: Goal: Respiratory complications will improve Outcome: Progressing   Problem: Safety: Goal: Ability to remain free from injury will improve Outcome: Progressing   Problem: Pain Managment: Goal: General experience of comfort will improve Outcome: Progressing   Problem: Skin Integrity: Goal: Risk for impaired skin integrity will decrease Outcome: Progressing   Problem: Cardiac: Goal: Ability to achieve and maintain adequate cardiopulmonary perfusion will improve Outcome: Progressing

## 2021-06-07 ENCOUNTER — Other Ambulatory Visit: Payer: Self-pay

## 2021-06-07 ENCOUNTER — Ambulatory Visit (HOSPITAL_COMMUNITY): Payer: PPO

## 2021-06-07 DIAGNOSIS — I2510 Atherosclerotic heart disease of native coronary artery without angina pectoris: Secondary | ICD-10-CM | POA: Diagnosis not present

## 2021-06-07 DIAGNOSIS — Z79899 Other long term (current) drug therapy: Secondary | ICD-10-CM | POA: Diagnosis not present

## 2021-06-07 DIAGNOSIS — E039 Hypothyroidism, unspecified: Secondary | ICD-10-CM | POA: Diagnosis not present

## 2021-06-07 DIAGNOSIS — I5030 Unspecified diastolic (congestive) heart failure: Secondary | ICD-10-CM | POA: Diagnosis not present

## 2021-06-07 DIAGNOSIS — I442 Atrioventricular block, complete: Secondary | ICD-10-CM | POA: Diagnosis not present

## 2021-06-07 DIAGNOSIS — Z950 Presence of cardiac pacemaker: Secondary | ICD-10-CM | POA: Diagnosis not present

## 2021-06-07 DIAGNOSIS — Z7982 Long term (current) use of aspirin: Secondary | ICD-10-CM | POA: Diagnosis not present

## 2021-06-07 MED ORDER — METOPROLOL SUCCINATE ER 25 MG PO TB24
25.0000 mg | ORAL_TABLET | Freq: Every day | ORAL | Status: DC
  Administered 2021-06-07: 25 mg via ORAL
  Filled 2021-06-07: qty 1

## 2021-06-07 MED ORDER — METOPROLOL SUCCINATE ER 25 MG PO TB24: 25.0000 mg | ORAL_TABLET | Freq: Every day | ORAL | 5 refills | Status: DC

## 2021-06-07 NOTE — Discharge Summary (Signed)
ELECTROPHYSIOLOGY PROCEDURE DISCHARGE SUMMARY    Patient ID: Lori Rosario,  MRN: [id number], DOB/AGE: [birth year] 80 y.o.  Admit date: 06/06/2021 Discharge date: 06/07/2021  Primary Care Physician: Sofie Hartigan, MD  Primary Cardiologist: Dr. Saunders Revel Electrophysiologist: new, Dr. Quentin Ore  Primary Discharge Diagnosis:  Syncope CHB  Secondary Discharge Diagnosis:  Stress CM Recovered LVEF 2. Hypothyroidism   No Known Allergies   Procedures This Admission:  1.  Implantation of a Biotronik dual chamber PPM on 06/06/21 by Dr Quentin Ore.   pulse generator (model Edora 8 DR-T, serial [id number] RV lead (model Solia S 60, serial [id number]) was passed through the sheath and fixed to the RV septum RA lead (model Solia S 53, serial [id number]) 2.  CXR on 06/07/21 demonstrated no pneumothorax status post device implantation.   Brief HPI: Lori Rosario is a 80 y.o. female was  admitted to [facility] 06/05/21 after a syncopal event, cardiology consult reported "was at home in her kitchen trying to make a snack when she suddenly lost consciousness, fell to the ground hitting the back of her head.  Her daughter states patient was down for couple of minutes." Unchanged EKG with baseline RBBB/1st degree AVBlock, CT head no acute findings. On telemetry however had episodes of advanced heart block including CHB. She was admitted with plans for transfer to [facility] for EP and possible PPM with no reveersible causes noted  Hospital Course:  The patient was admitted and monitored on telemetry, BP remained stable, she had intermittent episodes of CHB on  telemetry, labs unremarkable, and transferred yesterday to [facility] for EP evaluation and PPM. She underwent implantation of a PPM with details as outlined above.  She was monitored on telemetry overnight which demonstrated SR with some AV conduction though mostly SR/V paced, sinus rates 90's-110s (as she was pre-pacemaker procedure).  Telemetry noted pacing  behavior c/w hysteresis, and resulting in sharp fluctuations in HRs, and was programmed off.  Left chest was without hematoma or ecchymosis.  The device was interrogated and found to be functioning normally.  CXR was obtained and demonstrated no pneumothorax status post device implantation.  Wound care, arm mobility, and restrictions were reviewed with the patient.  The patient feels well, denies any CP/SOB, with minimal site discomfort.  She was examined by Dr. Quentin Ore and considered stable for discharge to home.   We will restart BB with Toprol 25mg  daily   Physical Exam: Vitals:   06/06/21 1725 06/06/21 1744 06/06/21 2038 06/07/21 0509  BP: (!) 156/67 (!) 160/67 131/69 (!) 126/58  Pulse: 87 97 (!) 110 87  Resp: 17 20 19 19   Temp:  98.5 F (36.9 C) 98.9 F (37.2 C) 98.7 F (37.1 C)  TempSrc:  Oral Oral Oral  SpO2: 97% 98% 97% 96%    GEN- The patient is well appearing, alert and oriented x 3 today.   HEENT: normocephalic, atraumatic; sclera clear, conjunctiva pink; hearing intact; oropharynx clear; neck supple, no JVP Lungs- CTA b/l, normal work of breathing.  No wheezes, rales, rhonchi Heart- RRR, no murmurs, rubs or gallops, PMI not laterally displaced GI- soft, non-tender, non-distended Extremities- no clubbing, cyanosis, or edema MS- no significant deformity or atrophy Skin- warm and dry, no rash or lesion, left chest without hematoma/ecchymosis Psych- euthymic mood, full affect Neuro- no gross deficits   Labs:   Lab Results  Component Value Date   WBC 4.7 06/06/2021   HGB 14.9 06/06/2021   HCT 44.7 06/06/2021   MCV 88.9 06/06/2021  PLT 259 06/06/2021    Recent Labs  Lab 06/06/21 0622  NA 139  K 3.9  CL 104  CO2 30  BUN 21  CREATININE 0.64  CALCIUM 8.8*  GLUCOSE 86    Discharge Medications:  Allergies as of 06/07/2021   No Known Allergies      Medication List     TAKE these medications    aspirin 81 MG EC tablet Take 1 tablet (81 mg total) by  mouth daily. Swallow whole.   atorvastatin 40 MG tablet Commonly known as: LIPITOR Take 1 tablet (40 mg total) by mouth daily.   CALCIUM CITRATE +D PO Take 1 tablet by mouth every morning.   erythromycin ophthalmic ointment Place 1 application into the right eye at bedtime.   furosemide 20 MG tablet Commonly known as: LASIX Take 1 tablet (20 mg total) by mouth daily as needed for edema (or shortness of breath).   gatifloxacin 0.5 % Soln Commonly known as: ZYMAXID Place 1 drop into the right eye 4 (four) times daily.   meclizine 25 MG tablet Commonly known as: ANTIVERT Take 1 tablet (25 mg total) by mouth 3 (three) times daily as needed for dizziness.   metoprolol succinate 25 MG 24 hr tablet Commonly known as: TOPROL-XL Take 1 tablet (25 mg total) by mouth daily.   multivitamin with minerals Tabs tablet Take 1 tablet by mouth every morning.   Synthroid 137 MCG tablet Generic drug: levothyroxine Take 68.5 mcg by mouth at bedtime.   VITAMIN D3 PO Take 1 tablet by mouth every morning.               Discharge Care Instructions  (From admission, onward)           Start     Ordered   06/07/21 0000  Discharge wound care:       Comments: As per AVS   06/07/21 0935            Disposition: Home Discharge Instructions     Diet - low sodium heart healthy   Complete by: As directed    Discharge wound care:   Complete by: As directed    As per AVS   Increase activity slowly   Complete by: As directed        Follow-up Information     [facility] Follow up.   Specialty: Cardiology Why: 06/16/21 @ 10:40AM, wound check visit Contact information: [address] [place]        Vickie Epley, MD Follow up.   Specialties: Cardiology, Radiology Why: 09/14/21 @ 11:00AM Contact information: [place] [address] [phone]                  Duration of Discharge Encounter: Greater than 30 minutes including physician time.  Venetia Night, PA-C 06/07/2021 9:35 AM

## 2021-06-07 NOTE — Discharge Instructions (Signed)
    Supplemental Discharge Instructions for  Pacemaker/Defibrillator Patients    Activity No heavy lifting or vigorous activity with your left/right arm for 6 to 8 weeks.  Do not raise your left/right arm above your head for one week.  Gradually raise your affected arm as drawn below.              06/11/21                   06/12/21                 06/13/21                 06/14/21 __  NO DRIVING until cleared to at your wound check visit.  WOUND CARE Keep the wound area clean and dry.  Do not get this area wet , no showers for one week; you may shower on 06/14/21  . The tape/steri-strips on your wound will fall off; do not pull them off.  No bandage is needed on the site.  DO  NOT apply any creams, oils, or ointments to the wound area. If you notice any drainage or discharge from the wound, any swelling or bruising at the site, or you develop a fever > 101? F after you are discharged home, call the office at once.  Special Instructions You are still able to use cellular telephones; use the ear opposite the side where you have your pacemaker/defibrillator.  Avoid carrying your cellular phone near your device. When traveling through airports, show security personnel your identification card to avoid being screened in the metal detectors.  Ask the security personnel to use the hand wand. Avoid arc welding equipment, MRI testing (magnetic resonance imaging), TENS units (transcutaneous nerve stimulators).  Call the office for questions about other devices. Avoid electrical appliances that are in poor condition or are not properly grounded. Microwave ovens are safe to be near or to operate.

## 2021-06-08 MED FILL — Lidocaine HCl Local Inj 1%: INTRAMUSCULAR | Qty: 45 | Status: AC

## 2021-06-09 DIAGNOSIS — E7800 Pure hypercholesterolemia, unspecified: Secondary | ICD-10-CM | POA: Diagnosis not present

## 2021-06-09 DIAGNOSIS — E890 Postprocedural hypothyroidism: Secondary | ICD-10-CM | POA: Diagnosis not present

## 2021-06-15 DIAGNOSIS — I442 Atrioventricular block, complete: Secondary | ICD-10-CM | POA: Diagnosis not present

## 2021-06-15 DIAGNOSIS — E7800 Pure hypercholesterolemia, unspecified: Secondary | ICD-10-CM | POA: Diagnosis not present

## 2021-06-15 DIAGNOSIS — Z0000 Encounter for general adult medical examination without abnormal findings: Secondary | ICD-10-CM | POA: Diagnosis not present

## 2021-06-15 DIAGNOSIS — E890 Postprocedural hypothyroidism: Secondary | ICD-10-CM | POA: Diagnosis not present

## 2021-06-15 DIAGNOSIS — Z23 Encounter for immunization: Secondary | ICD-10-CM | POA: Diagnosis not present

## 2021-06-15 DIAGNOSIS — F4321 Adjustment disorder with depressed mood: Secondary | ICD-10-CM | POA: Diagnosis not present

## 2021-06-16 ENCOUNTER — Other Ambulatory Visit: Payer: Self-pay

## 2021-06-16 ENCOUNTER — Ambulatory Visit (INDEPENDENT_AMBULATORY_CARE_PROVIDER_SITE_OTHER): Payer: PPO

## 2021-06-16 DIAGNOSIS — I442 Atrioventricular block, complete: Secondary | ICD-10-CM | POA: Diagnosis not present

## 2021-06-16 LAB — CUP PACEART INCLINIC DEVICE CHECK
Brady Statistic RA Percent Paced: 0 %
Brady Statistic RV Percent Paced: 100 %
Date Time Interrogation Session: 20221020110124
Implantable Lead Implant Date: 20221010
Implantable Lead Implant Date: 20221010
Implantable Lead Location: 753859
Implantable Lead Location: 753860
Implantable Lead Model: 377171
Implantable Lead Model: 377171
Implantable Lead Serial Number: 8000510718
Implantable Lead Serial Number: 8000555079
Implantable Pulse Generator Implant Date: 20221010
Lead Channel Impedance Value: 507 Ohm
Lead Channel Impedance Value: 565 Ohm
Lead Channel Pacing Threshold Amplitude: 0.7 V
Lead Channel Pacing Threshold Amplitude: 0.9 V
Lead Channel Pacing Threshold Pulse Width: 0.4 ms
Lead Channel Pacing Threshold Pulse Width: 0.4 ms
Lead Channel Sensing Intrinsic Amplitude: 2.5 mV
Lead Channel Sensing Intrinsic Amplitude: 25.1 mV
Lead Channel Setting Pacing Amplitude: 3 V
Lead Channel Setting Pacing Amplitude: 3 V
Lead Channel Setting Pacing Pulse Width: 0.4 ms
Pulse Gen Model: 407145
Pulse Gen Serial Number: 70231665

## 2021-06-16 NOTE — Progress Notes (Signed)
Wound check appointment. Steri-strips removed. Wound without redness or edema. Incision edges approximated, wound well healed. Normal device function. Thresholds, sensing, and impedances consistent with implant measurements. Device programmed at 3.0V for extra safety margin until 3 month visit. Histogram distribution appropriate for patient and level of activity. No mode switches or high ventricular rates noted. Patient educated about wound care, arm mobility, lifting restrictions. Patient is enrolled in remote monitoring, transmitting nightly, next summary report due 09/06/21.  ROV with Dr. Quentin Ore on 09/14/21.

## 2021-06-16 NOTE — Patient Instructions (Signed)

## 2021-06-17 DIAGNOSIS — R569 Unspecified convulsions: Secondary | ICD-10-CM | POA: Diagnosis not present

## 2021-06-24 DIAGNOSIS — R569 Unspecified convulsions: Secondary | ICD-10-CM | POA: Diagnosis not present

## 2021-06-28 NOTE — Progress Notes (Signed)
Cardiology Office Note    Date:  06/29/2021   ID:  Lori Rosario, Lori Rosario [birth year], MRN [id number]  PCP:  Sofie Hartigan, MD  Cardiologist:  Nelva Bush, MD  Electrophysiologist:  None   Chief Complaint: Hospital follow-up  History of Present Illness:   Lori Rosario is a 80 y.o. female with history of CAD, HFrEF secondary to stress-induced cardiomyopathy with normalization of LV systolic function by echo in 03/2021, syncope with complete heart block status post PPM on 06/06/2021, hyperthyroidism status post radioactive iodine therapy in her 55s now on replacement therapy, and HLD who's husband passed away in early 2021/03/15 who presents for hospital follow-up as outlined below.   Prior to her admission in 15-Mar-2021 she did not have any known cardiac disease.  Initially, she presented to an urgent care with right otalgia.  Prior to leaving her appointment she developed substernal chest pain without radiation or associated symptoms.  EKG showed inferior lateral ST elevation.  In this setting she was transferred to [facility] ED.  She underwent emergent LHC which showed moderate single-vessel CAD with 50 to 60% proximal LAD stenosis, severely reduced LVEF with mid and apical hypokinesis/akinesis and hyperdynamic basal contraction with findings consistent with stress-induced cardiomyopathy, moderately to severely elevated LVEDP of approximately 35 mmHg.  Medical management and diuresis was recommended.  Echo on 03/05/2021 showed an EF of 25 to 30%, akinesis of mid and apical walls with hyperdynamic basal function with findings consistent with Takotsubo stress-induced dilated cardiomyopathy, grade 1 diastolic dysfunction, normal RV systolic function and ventricular cavity size, a pericardial effusion surrounding the apex and anterior to the right ventricle was noted, trivial mitral regurgitation, mild aortic valve sclerosis without evidence of stenosis, and no evidence of mural apical thrombus, with or without  Definity.  High-sensitivity troponin peaked at 4462.  She was discharged on low-dose Toprol-XL and as needed Lasix.  Relative hypotension precluded escalation of further GDMT.    She was seen in hospital follow-up on 04/06/2021 and was doing very well from a cardiac perspective.  She was ambulating one third of a mile per day without issues.  She was tolerating medications.  Repeat limited echo on 04/20/2021 showed normalization of LV systolic function with an EF of 55 to 60%, no regional wall motion abnormalities, normal RV systolic function and ventricular cavity size, and no significant valvular abnormalities.  Following her office visit in 03/2021, metoprolol was discontinued as there was concern this was contributing to her dizziness.   She was evaluated in the ED on 05/06/2021 with persistent dizziness.  Laboratory evaluation was unrevealing including high-sensitivity troponin.  MRI brain was nonacute.  EKG demonstrated sinus rhythm with known first-degree AV block.  Treated with a dose of meclizine with improvement in symptoms.  She was advised to follow-up with ENT as an outpatient.  She was evaluated by ENT earlier this week with symptoms consistent with vertigo.  She was seen in the office in 04/2021 and continued to note intermittent dizziness described as a room spinning sensation.  She reported meclizine had led to resolution of these episodes.  Home BP/HR log showed BP readings from 95-148/59-78 with heart rates ranging from 56 to 101 bpm with the majority of her BP readings in the 509T to 267T systolic and heart rates in the 80s to 90s bpm.  Given symptoms, she underwent outpatient cardiac monitoring.  Upon downloading of her Zio patch, she was found to have 2 episodes of complete heart block with heart rates  ranging between 34 and 37 bpm with episodes persisting approximately 7 to 8 seconds.  There were 4 episodes of ventricular systole due to high-grade AV block.  Upon our office contacting the  patient, she had not had any further syncope.  She reported her neurologist had recommended she discontinue atorvastatin, and with this her dizziness had improved.  EP referral was placed.  Before she could be evaluated by EP, she was admitted to [facility] on 10/9 with syncope.  EKG demonstrated sinus rhythm with first-degree AV block, right bundle branch block, and a heart rate of 87 bpm.  High-sensitivity troponin normal x2.  BNP 80.  CT head showed no acute intracranial abnormalities.  She was transferred to [facility] where she was evaluated by EP and underwent implantation of Biotronik dual-chamber pacemaker on 06/06/2021 without significant complication.  She comes in doing very well from a cardiac perspective.  No chest pain, dyspnea, palpitations, presyncope, or syncope.  She does continue to note intermittent dizziness, more noticeable with quick positional changes.  With this, neurology has held her atorvastatin.  She has also undergone 2 EEGs with results pending at this time.  No lower extremity swelling or orthopnea.  At time of her admission to [facility], following pacemaker implantation, Toprol-XL was restarted.  Overall, she feels significantly improved.   Labs independently reviewed: 05/2021 - potassium 4.3, BUN 18, serum creatinine 0.6, albumin 3.9, AST/ALT normal, TC 211, TG 153, HDL 51, LDL 129, TSH normal, Hgb 14.9, PLT 259, magnesium 2.4  Past Medical History:  Diagnosis Date   CAD (coronary artery disease)    HFrEF (heart failure with reduced ejection fraction) ([place])    Hyperlipidemia LDL goal <70    Takotsubo cardiomyopathy    Thyroid disease     Past Surgical History:  Procedure Laterality Date   COLONOSCOPY  2013   cleared for 10 yrs- Dr @ [place]     LEFT HEART CATH AND CORONARY ANGIOGRAPHY N/A 03/04/2021   Procedure: LEFT HEART CATH AND CORONARY ANGIOGRAPHY;  Surgeon: Nelva Bush, MD;  Location: [place] CV LAB;  Service: Cardiovascular;   Laterality: N/A;   PACEMAKER IMPLANT N/A 06/06/2021   Procedure: PACEMAKER IMPLANT;  Surgeon: Vickie Epley, MD;  Location: [place] CV LAB;  Service: Cardiovascular;  Laterality: N/A;    Current Medications: Current Meds  Medication Sig   aspirin EC 81 MG EC tablet Take 1 tablet (81 mg total) by mouth daily. Swallow whole.   Calcium Citrate-Vitamin D (CALCIUM CITRATE +D PO) Take 1 tablet by mouth every morning.   Cholecalciferol (VITAMIN D3 PO) Take 1 tablet by mouth every morning.   meclizine (ANTIVERT) 25 MG tablet Take 1 tablet (25 mg total) by mouth 3 (three) times daily as needed for dizziness.   Multiple Vitamin (MULTIVITAMIN WITH MINERALS) TABS tablet Take 1 tablet by mouth every morning.   SYNTHROID 137 MCG tablet Take 68.5 mcg by mouth at bedtime.   [DISCONTINUED] metoprolol succinate (TOPROL-XL) 25 MG 24 hr tablet Take 1 tablet (25 mg total) by mouth daily.    Allergies:   Patient has no known allergies.   Social History   Socioeconomic History   Marital status: Married    Spouse name: Not on file   Number of children: Not on file   Years of education: Not on file   Highest education level: Not on file  Occupational History   Not on file  Tobacco Use   Smoking status: Never  Smokeless tobacco: Never  Vaping Use   Vaping Use: Never used  Substance and Sexual Activity   Alcohol use: No    Alcohol/week: 0.0 standard drinks   Drug use: No   Sexual activity: Not Currently  Other Topics Concern   Not on file  Social History Narrative   Not on file   Social Determinants of Health   Financial Resource Strain: Not on file  Food Insecurity: Not on file  Transportation Needs: Not on file  Physical Activity: Not on file  Stress: Not on file  Social Connections: Not on file     Family History:  The patient's family history includes Heart disease in her father.  ROS:   Full-12 point review of systems has been performed and is negative unless otherwise  noted in the HPI.   EKGs/Labs/Other Studies Reviewed:    Studies reviewed were summarized above. The additional studies were reviewed today:  LHC 03/04/2021: Conclusions: Moderate single-vessel coronary artery disease with 50 to 60% proximal LAD stenosis. Severely reduced LVEF with mid and apical hypokinesis/akinesis and hyperdynamic basilar contraction.  Findings are consistent with stress-induced cardiomyopathy. Moderately-severely elevated left ventricular filling pressure (LVEDP ~35 mmHg).   Recommendations: Admit to ICU for monitoring and medical optimization. Gentle diuresis. Add low-dose metoprolol as blood pressure tolerates. If blood pressure and renal function allow, consider addition of ACE inhibitor/ARB as soon as tomorrow. Aspirin and statin therapy to prevent progression of LAD disease. Obtain echocardiogram tomorrow with particular attention to the LV apex, as the patient is at risk for thrombus formation. __________   2D echo 03/05/2021: 1. Findings consistent with Takatsubo stress induced DCM. Akinesis of mid  and apical walls with hyperdyanamic basal function No mural apical  thrombus with or without definity . Left ventricular ejection fraction, by  estimation, is 25 to 30%. The left  ventricle has severely decreased function. The left ventricle has no  regional wall motion abnormalities. Left ventricular diastolic parameters  are consistent with Grade I diastolic dysfunction (impaired relaxation).   2. Right ventricular systolic function is normal. The right ventricular  size is normal.   3. The pericardial effusion is surrounding the apex and anterior to the  right ventricle.   4. The mitral valve is abnormal. Trivial mitral valve regurgitation. No  evidence of mitral stenosis.   5. The aortic valve is tricuspid. There is mild calcification of the  aortic valve. Aortic valve regurgitation is not visualized. Mild aortic  valve sclerosis is present, with no  evidence of aortic valve stenosis.   6. The inferior vena cava is normal in size with greater than 50%  respiratory variability, suggesting right atrial pressure of 3 mmHg. __________   Limited echo 04/20/2021: 1. Left ventricular ejection fraction, by estimation, is 55 to 60%. The  left ventricle has normal function. The left ventricle has no regional  wall motion abnormalities.   2. Right ventricular systolic function is normal. The right ventricular  size is normal.   3. The aortic valve was not well visualized. Aortic valve regurgitation  is not visualized.  __________  Elwyn Reach patch 04/2021: HR 21 - 115, average 85bpm. 1151 pauses, longest lasting 10 seconds. High degree AV block was present. Rare supraventricular and ventricular ectopy.    EKG:  EKG is ordered today.  The EKG ordered today demonstrates a sensed V paced rhythm, 71 bpm  Recent Labs: 03/04/2021: ALT 18; TSH 1.963 06/05/2021: B Natriuretic Peptide 80.7 06/06/2021: BUN 21; Creatinine, Ser 0.64; Hemoglobin 14.9; Magnesium  2.4; Platelets 259; Potassium 3.9; Sodium 139  Recent Lipid Panel    Component Value Date/Time   CHOL 234 (H) 03/04/2021 1613   CHOL 227 (H) 03/10/2016 0827   TRIG 157 (H) 03/04/2021 1613   HDL 56 03/04/2021 1613   HDL 65 03/10/2016 0827   CHOLHDL 4.2 03/04/2021 1613   VLDL 31 03/04/2021 1613   LDLCALC 147 (H) 03/04/2021 1613   LDLCALC 144 (H) 03/10/2016 0827    PHYSICAL EXAM:    VS:  BP 118/60 (BP Location: Left Arm, Patient Position: Sitting, Cuff Size: Normal)   Pulse 71   Ht 5\' 2"  (1.575 m)   Wt 122 lb 6 oz (55.5 kg)   SpO2 98%   BMI 22.38 kg/m   BMI: Body mass index is 22.38 kg/m.  Physical Exam Vitals reviewed.  Constitutional:      Appearance: She is well-developed.  HENT:     Head: Normocephalic and atraumatic.  Eyes:     General:        Right eye: No discharge.        Left eye: No discharge.  Neck:     Vascular: No JVD.  Cardiovascular:     Rate and Rhythm: Normal  rate and regular rhythm.     Pulses:          Posterior tibial pulses are 2+ on the right side and 2+ on the left side.     Heart sounds: Normal heart sounds, S1 normal and S2 normal. Heart sounds not distant. No midsystolic click and no opening snap. No murmur heard.   No friction rub.     Comments: Well-healing pacemaker implantation site without bleeding, swelling, warmth, erythema, dehiscence, or tenderness to palpation. Pulmonary:     Effort: Pulmonary effort is normal. No respiratory distress.     Breath sounds: Normal breath sounds. No decreased breath sounds, wheezing or rales.  Chest:     Chest wall: No tenderness.  Abdominal:     General: There is no distension.     Palpations: Abdomen is soft.     Tenderness: There is no abdominal tenderness.  Musculoskeletal:     Cervical back: Normal range of motion.     Right lower leg: No edema.     Left lower leg: No edema.  Skin:    General: Skin is warm and dry.     Nails: There is no clubbing.  Neurological:     Mental Status: She is alert and oriented to person, place, and time.  Psychiatric:        Speech: Speech normal.        Behavior: Behavior normal.        Thought Content: Thought content normal.        Judgment: Judgment normal.    Wt Readings from Last 3 Encounters:  06/29/21 122 lb 6 oz (55.5 kg)  06/05/21 122 lb 9.2 oz (55.6 kg)  05/13/21 120 lb (54.4 kg)     ASSESSMENT & PLAN:   Nonobstructive CAD without angina: She is doing well without any symptoms concerning for angina.  Continue ASA.  No longer on Toprol-XL as outlined below.  Aggressive risk factor modification.  No indication for further ischemic testing at this time.  HFrEF secondary to Takotsubo cardiomyopathy with subsequent normalization of LV systolic function: She appears euvolemic and well compensated.  Most recent echo demonstrated normalization of LV systolic function as outlined above.  Previously, Toprol-XL was discontinued in the context of  dizziness, and  normalization of LV systolic function.  She was subsequently diagnosed with complete heart block, while not on beta-blocker, and is now status post pacemaker as outlined below.  Following pacemaker implantation, she was resumed on Toprol-XL.  With noted dizziness, we will decrease Toprol-XL to 12.5 mg daily.  Given normalization of LV systolic function and dizziness we will defer addition of ACEi/ARB/ARN I/MRA/SGLT2i.  Syncope with history of complete heart block: Status post Biotronik dual-chamber PPM on 06/06/2021.  Device appears to be functioning normally.  Follow-up with EP as directed.  Dizziness: Appears to be more positional in nature.  Decrease Toprol-XL to 12.5 mg daily.  If symptoms persist, would discontinue Toprol-XL altogether.  Await neurology evaluation.  HLD: LDL 147 in 02/2021 with goal being less than 70.  Started on atorvastatin in 02/2021, which has been held by neurology given dizziness and while undergoing EEG.  If okay with neurology, look to resume this in follow-up.  Disposition: F/u with Dr. Saunders Revel or an APP in 3 months.   Medication Adjustments/Labs and Tests Ordered: Current medicines are reviewed at length with the patient today.  Concerns regarding medicines are outlined above. Medication changes, Labs and Tests ordered today are summarized above and listed in the Patient Instructions accessible in Encounters.   Signed, Christell Faith, PA-C 06/29/2021 10:53 AM     [facility] [address] [place] [address] [place], [place] [address] [phone]

## 2021-06-29 ENCOUNTER — Ambulatory Visit: Payer: PPO | Admitting: Physician Assistant

## 2021-06-29 ENCOUNTER — Other Ambulatory Visit: Payer: Self-pay

## 2021-06-29 ENCOUNTER — Encounter: Payer: Self-pay | Admitting: Physician Assistant

## 2021-06-29 VITALS — BP 118/60 | HR 71 | Ht 62.0 in | Wt 122.4 lb

## 2021-06-29 DIAGNOSIS — R55 Syncope and collapse: Secondary | ICD-10-CM | POA: Diagnosis not present

## 2021-06-29 DIAGNOSIS — R42 Dizziness and giddiness: Secondary | ICD-10-CM

## 2021-06-29 DIAGNOSIS — I5181 Takotsubo syndrome: Secondary | ICD-10-CM | POA: Diagnosis not present

## 2021-06-29 DIAGNOSIS — E785 Hyperlipidemia, unspecified: Secondary | ICD-10-CM | POA: Diagnosis not present

## 2021-06-29 DIAGNOSIS — I441 Atrioventricular block, second degree: Secondary | ICD-10-CM | POA: Diagnosis not present

## 2021-06-29 DIAGNOSIS — I442 Atrioventricular block, complete: Secondary | ICD-10-CM

## 2021-06-29 DIAGNOSIS — I2510 Atherosclerotic heart disease of native coronary artery without angina pectoris: Secondary | ICD-10-CM

## 2021-06-29 MED ORDER — METOPROLOL SUCCINATE ER 25 MG PO TB24: 12.5000 mg | ORAL_TABLET | Freq: Every day | ORAL | 3 refills | Status: DC

## 2021-06-29 NOTE — Patient Instructions (Addendum)
Medication Instructions:  Your physician has recommended you make the following change in your medication:   DECREASE Toprol XL (Metoprolol succinate) 25 mg and take one half tablet (12.5 mg) once daily.   *If you need a refill on your cardiac medications before your next appointment, please call your pharmacy*   Lab Work: None  If you have labs (blood work) drawn today and your tests are completely normal, you will receive your results only by: [place] (if you have MyChart) OR A paper copy in the mail If you have any lab test that is abnormal or we need to change your treatment, we will call you to review the results.   Testing/Procedures: None   Follow-Up: At [facility], you and your health needs are our priority.  As part of our continuing mission to provide you with exceptional heart care, we have created designated Provider Care Teams.  These Care Teams include your primary Cardiologist (physician) and Advanced Practice Providers (APPs -  Physician Assistants and Nurse Practitioners) who all work together to provide you with the care you need, when you need it.   Your next appointment:   3 month(s)  The format for your next appointment:   In Person  Provider:   Nelva Bush, MD or Christell Faith, PA-C

## 2021-06-30 DIAGNOSIS — R42 Dizziness and giddiness: Secondary | ICD-10-CM | POA: Diagnosis not present

## 2021-06-30 DIAGNOSIS — G253 Myoclonus: Secondary | ICD-10-CM | POA: Diagnosis not present

## 2021-07-01 ENCOUNTER — Ambulatory Visit: Payer: PPO | Admitting: Physician Assistant

## 2021-07-18 ENCOUNTER — Other Ambulatory Visit: Payer: Self-pay

## 2021-07-18 MED ORDER — ASPIRIN 81 MG PO TBEC: 81.0000 mg | DELAYED_RELEASE_TABLET | Freq: Every day | ORAL | 3 refills | Status: DC

## 2021-08-05 ENCOUNTER — Telehealth: Payer: Self-pay | Admitting: Internal Medicine

## 2021-08-05 NOTE — Telephone Encounter (Signed)
Ok to discontinue metoprolol altogether (EF has normalized).

## 2021-08-05 NOTE — Telephone Encounter (Signed)
Spoke with patient and she reports the Toprol XL has been causing continued dizziness, unsteadiness, and is fearful of falling. Inquired about her blood pressures and she states they have been good. She does not check them often but when she does they have been normal see below. She really wants to know if she can stop this medication due to the intolerance and concerns of falling. She takes it in the evening and when she needs to get up during the night she is not steady on her feet. Advised I would send over to provider for his review and recommendations and that I would give her a call back once I hear from him. She verbalized understanding with no further questions at this time.   She did provide the following readings:   November readings 11/2 120/66 11/7 121/71 11/18 129/72 11/26 123/75

## 2021-08-05 NOTE — Telephone Encounter (Signed)
Spoke with patient and reviewed provider was agreeable for her to stop medication. She was very pleased and grateful for the call back. She verbalized understanding with no further questions at this time.

## 2021-08-05 NOTE — Telephone Encounter (Signed)
Pt c/o medication issue:  1. Name of Medication: metoprolol   2. How are you currently taking this medication (dosage and times per day)? 25 MG 0.5 tablets daily   3. Are you having a reaction (difficulty breathing--STAT)? Still extremely dizzy   4. What is your medication issue? Wants to discuss if she may stop medication.  Please call to discuss.

## 2021-09-06 ENCOUNTER — Ambulatory Visit (INDEPENDENT_AMBULATORY_CARE_PROVIDER_SITE_OTHER): Payer: PPO

## 2021-09-06 DIAGNOSIS — I441 Atrioventricular block, second degree: Secondary | ICD-10-CM | POA: Diagnosis not present

## 2021-09-06 LAB — CUP PACEART REMOTE DEVICE CHECK
Date Time Interrogation Session: 20230110093137
Implantable Lead Implant Date: 20221010
Implantable Lead Implant Date: 20221010
Implantable Lead Location: 753859
Implantable Lead Location: 753860
Implantable Lead Model: 377171
Implantable Lead Model: 377171
Implantable Lead Serial Number: 8000510718
Implantable Lead Serial Number: 8000555079
Implantable Pulse Generator Implant Date: 20221010
Pulse Gen Model: 407145
Pulse Gen Serial Number: 70231665

## 2021-09-13 NOTE — Progress Notes (Signed)
Electrophysiology Office Follow up Visit Note:    Date:  09/14/2021   ID:  Nicola Police, DOB 1941/07/17, MRN [id number]  PCP:  Sofie Hartigan, MD  [facility] Cardiologist:  Nelva Bush, MD  [facility] Electrophysiologist:  Vickie Epley, MD    Interval History:    Lori Rosario is a 81 y.o. female who presents for a follow up visit.  She had a dual-chamber permanent pacemaker implanted for complete heart block on June 06, 2021.  Remote interrogations since implant have showed stable lead parameters.  She is ventricularly pacing 100% of the time on the most recent interrogation.  Her pacemaker system includes a left bundle area lead.  She is with her daughter today in clinic.  She tells me she feels significantly better since the pacemaker was implanted with an improved energy level.  Incision healed well.  She is no longer taking meclizine.     Past Medical History:  Diagnosis Date   CAD (coronary artery disease)    HFrEF (heart failure with reduced ejection fraction) ([place])    Hyperlipidemia LDL goal <70    Takotsubo cardiomyopathy    Thyroid disease     Past Surgical History:  Procedure Laterality Date   COLONOSCOPY  2013   cleared for 10 yrs- Dr @ [place] CATH AND CORONARY ANGIOGRAPHY N/A 03/04/2021   Procedure: LEFT HEART CATH AND CORONARY ANGIOGRAPHY;  Surgeon: Nelva Bush, MD;  Location: [place] CV LAB;  Service: Cardiovascular;  Laterality: N/A;   PACEMAKER IMPLANT N/A 06/06/2021   Procedure: PACEMAKER IMPLANT;  Surgeon: Vickie Epley, MD;  Location: [place] [facility];  Service: Cardiovascular;  Laterality: N/A;    Current Medications: Current Meds  Medication Sig   aspirin 81 MG EC tablet Take 1 tablet (81 mg total) by mouth daily. Swallow whole.   Calcium Citrate-Vitamin D (CALCIUM CITRATE +D PO) Take 1 tablet by mouth every morning.   Cholecalciferol (VITAMIN D3 PO) Take 1 tablet by mouth  every morning.   Multiple Vitamin (MULTIVITAMIN WITH MINERALS) TABS tablet Take 1 tablet by mouth every morning.   SYNTHROID 137 MCG tablet Take 68.5 mcg by mouth at bedtime.     Allergies:   Patient has no known allergies.   Social History   Socioeconomic History   Marital status: Married    Spouse name: Not on file   Number of children: Not on file   Years of education: Not on file   Highest education level: Not on file  Occupational History   Not on file  Tobacco Use   Smoking status: Never   Smokeless tobacco: Never  Vaping Use   Vaping Use: Never used  Substance and Sexual Activity   Alcohol use: No    Alcohol/week: 0.0 standard drinks   Drug use: No   Sexual activity: Not Currently  Other Topics Concern   Not on file  Social History Narrative   Not on file   Social Determinants of Health   Financial Resource Strain: Not on file  Food Insecurity: Not on file  Transportation Needs: Not on file  Physical Activity: Not on file  Stress: Not on file  Social Connections: Not on file     Family History: The patient's family history includes Heart disease in her father.  ROS:   Please see the history of present illness.    All other systems reviewed and are negative.  EKGs/Labs/Other Studies Reviewed:    The following studies were reviewed today:  September 14, 2021 in clinic device interrogation personally reviewed Battery okay.  Lead parameter stable.  100% ventricular pacing.  Changed lead outputs to maximize battery longevity.  Turned on capture control.  No atrial arrhythmias.  EKG:  The ekg ordered today demonstrates sinus rhythm, ventricular pacing.  V1 with a right bundle morphology.  QRS duration 130 ms.  Recent Labs: 03/04/2021: ALT 18; TSH 1.963 06/05/2021: B Natriuretic Peptide 80.7 06/06/2021: BUN 21; Creatinine, Ser 0.64; Hemoglobin 14.9; Magnesium 2.4; Platelets 259; Potassium 3.9; Sodium 139  Recent Lipid Panel    Component Value Date/Time    CHOL 234 (H) 03/04/2021 1613   CHOL 227 (H) 03/10/2016 0827   TRIG 157 (H) 03/04/2021 1613   HDL 56 03/04/2021 1613   HDL 65 03/10/2016 0827   CHOLHDL 4.2 03/04/2021 1613   VLDL 31 03/04/2021 1613   LDLCALC 147 (H) 03/04/2021 1613   LDLCALC 144 (H) 03/10/2016 0827    Physical Exam:    VS:  BP 102/60 (BP Location: Left Arm, Patient Position: Sitting, Cuff Size: Normal)    Pulse 80    Ht 5\' 2"  (1.575 m)    Wt 123 lb (55.8 kg)    SpO2 96%    BMI 22.50 kg/m     Wt Readings from Last 3 Encounters:  09/14/21 123 lb (55.8 kg)  06/29/21 122 lb 6 oz (55.5 kg)  06/05/21 122 lb 9.2 oz (55.6 kg)     GEN:  Well nourished, well developed in no acute distress HEENT: Normal NECK: No JVD; No carotid bruits LYMPHATICS: No lymphadenopathy CARDIAC: RRR, no murmurs, rubs, gallops.  Pacemaker pocket well-healed. RESPIRATORY:  Clear to auscultation without rales, wheezing or rhonchi  ABDOMEN: Soft, non-tender, non-distended MUSCULOSKELETAL:  No edema; No deformity  SKIN: Warm and dry NEUROLOGIC:  Alert and oriented x 3 PSYCHIATRIC:  Normal affect        ASSESSMENT:    1. Heart block AV complete (HCC)   2. Cardiac pacemaker in situ    PLAN:    In order of problems listed above:  #Complete heart block #Pacemaker in situ Doing well with her pacemaker.  Much improved energy level.  EKG shows excellent left bundle capture.  Continue remote monitoring.  Follow-up in clinic in 9 months or sooner as needed.    66-month follow-up    Medication Adjustments/Labs and Tests Ordered: Current medicines are reviewed at length with the patient today.  Concerns regarding medicines are outlined above.  No orders of the defined types were placed in this encounter.  No orders of the defined types were placed in this encounter.    Signed, Lars Mage, MD, [facility], [facility] 09/14/2021 11:17 AM    Electrophysiology [facility]

## 2021-09-14 ENCOUNTER — Ambulatory Visit: Payer: PPO | Admitting: Cardiology

## 2021-09-14 ENCOUNTER — Encounter: Payer: Self-pay | Admitting: Cardiology

## 2021-09-14 ENCOUNTER — Other Ambulatory Visit: Payer: Self-pay

## 2021-09-14 VITALS — BP 102/60 | HR 80 | Ht 62.0 in | Wt 123.0 lb

## 2021-09-14 DIAGNOSIS — Z950 Presence of cardiac pacemaker: Secondary | ICD-10-CM

## 2021-09-14 DIAGNOSIS — I442 Atrioventricular block, complete: Secondary | ICD-10-CM

## 2021-09-14 LAB — PACEMAKER DEVICE OBSERVATION

## 2021-09-14 NOTE — Patient Instructions (Signed)
Medications: Your physician recommends that you continue on your current medications as directed. Please refer to the Current Medication list given to you today. *If you need a refill on your cardiac medications before your next appointment, please call your pharmacy*  Lab Work: None. If you have labs (blood work) drawn today and your tests are completely normal, you will receive your results only by: [place] (if you have MyChart) OR A paper copy in the mail If you have any lab test that is abnormal or we need to change your treatment, we will call you to review the results.  Testing/Procedures: None.  Follow-Up: At [facility], you and your health needs are our priority.  As part of our continuing mission to provide you with exceptional heart care, we have created designated Provider Care Teams.  These Care Teams include your primary Cardiologist (physician) and Advanced Practice Providers (APPs -  Physician Assistants and Nurse Practitioners) who all work together to provide you with the care you need, when you need it.  Your physician wants you to follow-up in: 9 months with Lars Mage     You will receive a reminder letter in the mail two months in advance. If you don't receive a letter, please call our office to schedule the follow-up appointment.  We recommend signing up for the patient portal called "MyChart".  Sign up information is provided on this After Visit Summary.  MyChart is used to connect with patients for Virtual Visits (Telemedicine).  Patients are able to view lab/test results, encounter notes, upcoming appointments, etc.  Non-urgent messages can be sent to your provider as well.   To learn more about what you can do with MyChart, go to [email or web address].    Any Other Special Instructions Will Be Listed Below (If Applicable).

## 2021-09-15 NOTE — Progress Notes (Signed)
Remote pacemaker transmission.   

## 2021-09-25 ENCOUNTER — Encounter: Payer: Self-pay | Admitting: Internal Medicine

## 2021-09-25 NOTE — Progress Notes (Signed)
Follow-up Outpatient Visit Date: 09/29/2021  Primary Care Provider: Sofie Hartigan, MD [facility] [place] [address]  Chief Complaint: Follow-up cardiomyopathy and heart block  HPI:  Lori Rosario is a 81 y.o. female with history of nonobstructive coronary artery disease, HFrEF secondary to stress-induced cardiomyopathy with normalization of LVEF by echo in 03/2021, syncope with complete heart block status post PPM (Biotronik; 05/2021), hypothyroidism status post radioactive iodine therapy, and hyperlipidemia, who presents for follow-up of CAD and complete heart block.  She was last seen in our office in early November by Christell Faith, PA, at which time she was doing well other than intermittent dizziness.  This has resolved with discontinuation of metoprolol.  Today, Lori Rosario reports that she is feeling well.  She denies chest pain, shortness of breath, and palpitations.  Orthostatic lightheadedness has been less of an issue, as she has been getting up more slowly.  She is working on her diet to help improve her cholesterol.  She is also exercising regularly.  She denies any issues related to her pacemaker.  She continues remote monitoring through our device clinic.  --------------------------------------------------------------------------------------------------  Past Medical History:  Diagnosis Date   CAD (coronary artery disease)    Nonobstructive CAD   Complete heart block ([place]) 05/2021   s/p PPM   HFrEF (heart failure with reduced ejection fraction) ([place])    Hyperlipidemia LDL goal <70    Takotsubo cardiomyopathy    Thyroid disease    Past Surgical History:  Procedure Laterality Date   COLONOSCOPY  2013   cleared for 10 yrs- Dr @ [place] CATH AND CORONARY ANGIOGRAPHY N/A 03/04/2021   Procedure: LEFT HEART CATH AND CORONARY ANGIOGRAPHY;  Surgeon: Nelva Bush, MD;  Location: [facility];  Service: Cardiovascular;   Laterality: N/A;   PACEMAKER IMPLANT N/A 06/06/2021   Procedure: PACEMAKER IMPLANT;  Surgeon: Vickie Epley, MD;  Location: [facility];  Service: Cardiovascular;  Laterality: N/A;     Recent CV Pertinent Labs: Lab Results  Component Value Date   CHOL 234 (H) 03/04/2021   CHOL 227 (H) 03/10/2016   HDL 56 03/04/2021   HDL 65 03/10/2016   LDLCALC 147 (H) 03/04/2021   LDLCALC 144 (H) 03/10/2016   TRIG 157 (H) 03/04/2021   CHOLHDL 4.2 03/04/2021   INR 0.9 06/05/2021   BNP 80.7 06/05/2021   K 3.9 06/06/2021   MG 2.4 06/06/2021   BUN 21 06/06/2021   CREATININE 0.64 06/06/2021    Past medical and surgical history were reviewed and updated in EPIC.  Current Meds  Medication Sig   aspirin 81 MG EC tablet Take 1 tablet (81 mg total) by mouth daily. Swallow whole.   Calcium Citrate-Vitamin D (CALCIUM CITRATE +D PO) Take 1 tablet by mouth every morning.   Cholecalciferol (VITAMIN D3 PO) Take 1 tablet by mouth every morning.   meclizine (ANTIVERT) 25 MG tablet Take 1 tablet (25 mg total) by mouth 3 (three) times daily as needed for dizziness.   Multiple Vitamin (MULTIVITAMIN WITH MINERALS) TABS tablet Take 1 tablet by mouth every morning.   SYNTHROID 137 MCG tablet Take 68.5 mcg by mouth at bedtime.    Allergies: Patient has no known allergies.  Social History   Tobacco Use   Smoking status: Never   Smokeless tobacco: Never  Vaping Use   Vaping Use: Never used  Substance Use Topics   Alcohol use:  No    Alcohol/week: 0.0 standard drinks   Drug use: No    Family History  Problem Relation Age of Onset   Heart disease Father     Review of Systems: A 12-system review of systems was performed and was negative except as noted in the HPI.  --------------------------------------------------------------------------------------------------  Physical Exam: BP 110/60 (BP Location: Left Arm, Patient Position: Sitting, Cuff Size: Normal)    Pulse 92    Ht 5\' 2"  (1.575  m)    Wt 120 lb (54.4 kg)    SpO2 98%    BMI 21.95 kg/m   General:  NAD. Neck: No JVD or HJR. Lungs: Clear to auscultation bilaterally without wheezes or crackles. Heart: Regular rate and rhythm without murmurs, rubs, or gallops.  Left chest pacemaker incision was well-healed. Abdomen: Soft, nontender, nondistended. Extremities: No lower extremity edema.   Lab Results  Component Value Date   WBC 4.7 06/06/2021   HGB 14.9 06/06/2021   HCT 44.7 06/06/2021   MCV 88.9 06/06/2021   PLT 259 06/06/2021    Lab Results  Component Value Date   NA 139 06/06/2021   K 3.9 06/06/2021   CL 104 06/06/2021   CO2 30 06/06/2021   BUN 21 06/06/2021   CREATININE 0.64 06/06/2021   GLUCOSE 86 06/06/2021   ALT 18 03/04/2021    Lab Results  Component Value Date   CHOL 234 (H) 03/04/2021   HDL 56 03/04/2021   LDLCALC 147 (H) 03/04/2021   TRIG 157 (H) 03/04/2021   CHOLHDL 4.2 03/04/2021    --------------------------------------------------------------------------------------------------  ASSESSMENT AND PLAN: Stress-induced cardiomyopathy: Lori Rosario appears euvolemic without any symptoms of heart failure.  LVEF normalized on last echo in 03/2023.  She is currently off GDMT due to intolerance of metoprolol and low normal blood pressures in the past.  We will defer medication changes today.  No further work-up recommended at this time.  Complete heart block: Patient status post PPM.  Most recent device interrogation demonstrates 100% atrial sensing, ventricular pacing.  Continue ongoing follow-up through Dr. Quentin Ore in the device clinic.  Hyperlipidemia and coronary artery disease: Previously on atorvastatin but held by neurology due to dizziness.  Defer ongoing management to neurology and PCP.  Most recent LDL was 129 in 05/2021 when checked by Dr. Ellison Hughs.  Would favor rechallenging with statin, if possible, given moderate (50-60%) proximal LAD stenosis on catheterization last  summer.  Follow-up: Return to clinic in 6 months.  Nelva Bush, MD 10/01/2021 10:31 AM

## 2021-09-29 ENCOUNTER — Ambulatory Visit: Payer: PPO | Admitting: Internal Medicine

## 2021-09-29 ENCOUNTER — Encounter: Payer: Self-pay | Admitting: Internal Medicine

## 2021-09-29 ENCOUNTER — Other Ambulatory Visit: Payer: Self-pay

## 2021-09-29 VITALS — BP 110/60 | HR 92 | Ht 62.0 in | Wt 120.0 lb

## 2021-09-29 DIAGNOSIS — I442 Atrioventricular block, complete: Secondary | ICD-10-CM

## 2021-09-29 DIAGNOSIS — I2510 Atherosclerotic heart disease of native coronary artery without angina pectoris: Secondary | ICD-10-CM

## 2021-09-29 DIAGNOSIS — E785 Hyperlipidemia, unspecified: Secondary | ICD-10-CM | POA: Diagnosis not present

## 2021-09-29 DIAGNOSIS — I5181 Takotsubo syndrome: Secondary | ICD-10-CM | POA: Diagnosis not present

## 2021-09-29 NOTE — Patient Instructions (Signed)
Medication Instructions:   Your physician recommends that you continue on your current medications as directed. Please refer to the Current Medication list given to you today.  *If you need a refill on your cardiac medications before your next appointment, please call your pharmacy*   Lab Work:  None ordered  Testing/Procedures:  None ordered   Follow-Up: At [facility] you and your health needs are our priority.  As part of our continuing mission to provide you with exceptional heart care, we have created designated Provider Care Teams.  These Care Teams include your primary Cardiologist (physician) and Advanced Practice Providers (APPs -  Physician Assistants and Nurse Practitioners) who all work together to provide you with the care you need, when you need it.  We recommend signing up for the patient portal called "MyChart".  Sign up information is provided on this After Visit Summary.  MyChart is used to connect with patients for Virtual Visits (Telemedicine).  Patients are able to view lab/test results, encounter notes, upcoming appointments, etc.  Non-urgent messages can be sent to your provider as well.   To learn more about what you can do with MyChart, go to [email or web address].    Your next appointment:   6 month(s)  The format for your next appointment:   In Person  Provider:   You may see Nelva Bush, MD or one of the following Advanced Practice Providers on your designated Care Team:   Murray Hodgkins, NP Christell Faith, PA-C Cadence Kathlen Mody, [place]

## 2021-10-01 ENCOUNTER — Encounter: Payer: Self-pay | Admitting: Internal Medicine

## 2021-11-28 DIAGNOSIS — E7800 Pure hypercholesterolemia, unspecified: Secondary | ICD-10-CM | POA: Diagnosis not present

## 2021-12-06 ENCOUNTER — Ambulatory Visit (INDEPENDENT_AMBULATORY_CARE_PROVIDER_SITE_OTHER): Payer: PPO

## 2021-12-06 DIAGNOSIS — I441 Atrioventricular block, second degree: Secondary | ICD-10-CM | POA: Diagnosis not present

## 2021-12-06 LAB — CUP PACEART REMOTE DEVICE CHECK
Date Time Interrogation Session: 20230411080536
Implantable Lead Implant Date: 20221010
Implantable Lead Implant Date: 20221010
Implantable Lead Location: 753859
Implantable Lead Location: 753860
Implantable Lead Model: 377171
Implantable Lead Model: 377171
Implantable Lead Serial Number: 8000510718
Implantable Lead Serial Number: 8000555079
Implantable Pulse Generator Implant Date: 20221010
Pulse Gen Model: 407145
Pulse Gen Serial Number: 70231665

## 2021-12-22 NOTE — Progress Notes (Signed)
Remote pacemaker transmission.   

## 2022-01-06 DIAGNOSIS — M545 Low back pain, unspecified: Secondary | ICD-10-CM | POA: Diagnosis not present

## 2022-01-06 DIAGNOSIS — M62830 Muscle spasm of back: Secondary | ICD-10-CM | POA: Diagnosis not present

## 2022-01-30 DIAGNOSIS — J0100 Acute maxillary sinusitis, unspecified: Secondary | ICD-10-CM | POA: Diagnosis not present

## 2022-01-30 DIAGNOSIS — R051 Acute cough: Secondary | ICD-10-CM | POA: Diagnosis not present

## 2022-02-24 DIAGNOSIS — L821 Other seborrheic keratosis: Secondary | ICD-10-CM | POA: Diagnosis not present

## 2022-02-24 DIAGNOSIS — L738 Other specified follicular disorders: Secondary | ICD-10-CM | POA: Diagnosis not present

## 2022-02-24 DIAGNOSIS — L988 Other specified disorders of the skin and subcutaneous tissue: Secondary | ICD-10-CM | POA: Diagnosis not present

## 2022-03-07 ENCOUNTER — Ambulatory Visit (INDEPENDENT_AMBULATORY_CARE_PROVIDER_SITE_OTHER): Payer: PPO

## 2022-03-07 DIAGNOSIS — I442 Atrioventricular block, complete: Secondary | ICD-10-CM | POA: Diagnosis not present

## 2022-03-07 LAB — CUP PACEART REMOTE DEVICE CHECK
Date Time Interrogation Session: 20230710090813
Implantable Lead Implant Date: 20221010
Implantable Lead Implant Date: 20221010
Implantable Lead Location: 753859
Implantable Lead Location: 753860
Implantable Lead Model: 377171
Implantable Lead Model: 377171
Implantable Lead Serial Number: 8000510718
Implantable Lead Serial Number: 8000555079
Implantable Pulse Generator Implant Date: 20221010
Pulse Gen Model: 407145
Pulse Gen Serial Number: 70231665

## 2022-03-29 NOTE — Progress Notes (Signed)
Remote pacemaker transmission.   

## 2022-03-30 ENCOUNTER — Encounter: Payer: Self-pay | Admitting: Physician Assistant

## 2022-03-30 ENCOUNTER — Ambulatory Visit: Payer: PPO | Admitting: Physician Assistant

## 2022-03-30 VITALS — BP 100/54 | HR 84 | Ht 62.0 in | Wt 120.0 lb

## 2022-03-30 DIAGNOSIS — I5181 Takotsubo syndrome: Secondary | ICD-10-CM | POA: Diagnosis not present

## 2022-03-30 DIAGNOSIS — Z950 Presence of cardiac pacemaker: Secondary | ICD-10-CM

## 2022-03-30 DIAGNOSIS — E785 Hyperlipidemia, unspecified: Secondary | ICD-10-CM | POA: Diagnosis not present

## 2022-03-30 DIAGNOSIS — I2510 Atherosclerotic heart disease of native coronary artery without angina pectoris: Secondary | ICD-10-CM

## 2022-03-30 DIAGNOSIS — I442 Atrioventricular block, complete: Secondary | ICD-10-CM | POA: Diagnosis not present

## 2022-03-30 NOTE — Patient Instructions (Signed)
Medication Instructions:  No changes at this time.   *If you need a refill on your cardiac medications before your next appointment, please call your pharmacy*   Lab Work: None  If you have labs (blood work) drawn today and your tests are completely normal, you will receive your results only by: MyChart Message (if you have MyChart) OR A paper copy in the mail If you have any lab test that is abnormal or we need to change your treatment, we will call you to review the results.   Testing/Procedures: None   Follow-Up: At [facility], you and your health needs are our priority.  As part of our continuing mission to provide you with exceptional heart care, we have created designated Provider Care Teams.  These Care Teams include your primary Cardiologist (physician) and Advanced Practice Providers (APPs -  Physician Assistants and Nurse Practitioners) who all work together to provide you with the care you need, when you need it.   Your next appointment:   6 month(s)  The format for your next appointment:   In Person  Provider:   Christopher End, MD or Ryan Dunn, PA-C        Important Information About Sugar       

## 2022-03-30 NOTE — Progress Notes (Signed)
Cardiology Office Note    Date:  03/30/2022   ID:  [name], [name] May 12, 1941, MRN [id number]  PCP:  Sofie Hartigan, MD  Cardiologist:  Nelva Bush, MD  Electrophysiologist:  Vickie Epley, MD   Chief Complaint: Follow-up  History of Present Illness:   Lori Rosario is a 81 y.o. female with history of CAD, HFrEF secondary to stress-induced cardiomyopathy with normalization of LV systolic function by echo in 03/2021, syncope with complete heart block status post PPM on 06/06/2021, hyperthyroidism status post radioactive iodine therapy in her 53s now on replacement therapy, and HLD who presents for follow-up of her cardiomyopathy.   Prior to her admission in 02/2021 she did not have any known cardiac disease.  Initially, she presented to an urgent care with right otalgia.  Prior to leaving her appointment she developed substernal chest pain without radiation or associated symptoms.  EKG showed inferior lateral ST elevation.  In this setting she was transferred to [facility] ED.  She underwent emergent LHC which showed moderate single-vessel CAD with 50 to 60% proximal LAD stenosis, severely reduced LVEF with mid and apical hypokinesis/akinesis and hyperdynamic basal contraction with findings consistent with stress-induced cardiomyopathy, moderately to severely elevated LVEDP of approximately 35 mmHg.  Medical management and diuresis was recommended.  Echo on 03/05/2021 showed an EF of 25 to 30%, akinesis of mid and apical walls with hyperdynamic basal function with findings consistent with Takotsubo dilated cardiomyopathy, grade 1 diastolic dysfunction, normal RV systolic function and ventricular cavity size, a pericardial effusion surrounding the apex and anterior to the right ventricle was noted, trivial mitral regurgitation, mild aortic valve sclerosis without evidence of stenosis, and no evidence of mural apical thrombus, with or without Definity.  High-sensitivity troponin peaked at 4462.   She was discharged on low-dose Toprol-XL and as needed Lasix.  Relative hypotension precluded escalation of further GDMT.     She was seen in hospital follow-up on 04/06/2021 and was doing very well from a cardiac perspective.  She was ambulating one third of a mile per day without issues.  She was tolerating medications.  Repeat limited echo on 04/20/2021 showed normalization of LV systolic function with an EF of 55 to 60%, no regional wall motion abnormalities, normal RV systolic function and ventricular cavity size, and no significant valvular abnormalities.  Following her office visit in 03/2021, metoprolol was discontinued as there was concern this was contributing to her dizziness.   She was evaluated in the ED on 05/06/2021 with persistent dizziness.  Laboratory evaluation was unrevealing including high-sensitivity troponin.  MRI brain was nonacute.  EKG demonstrated sinus rhythm with known first-degree AV block.  Treated with a dose of meclizine with improvement in symptoms.  She was evaluated by ENT with symptoms felt to be related to vertigo.   She was seen in the office in 04/2021 and continued to note intermittent dizziness described as a room spinning sensation.  She reported meclizine had led to resolution of these episodes.  Home BP/HR log showed BP readings from 95-148/59-78 with heart rates ranging from 56 to 101 bpm with the majority of her BP readings in the 924Q to 683M systolic and heart rates in the 80s to 90s bpm.  Given symptoms, she underwent outpatient cardiac monitoring, which demonstrated two episodes of complete heart block with heart rates ranging between 34 and 37 bpm with episodes persisting approximately 7 to 8 seconds.  There were 4 episodes of high-grade AV block.  Upon our  office contacting the patient, she had not had any further syncope.  She reported her neurologist had recommended she discontinue atorvastatin, and with this her dizziness had improved.  She was referred to EP.   However, prior to this appointment, she was admitted to the hospital in 05/2021 with syncope.  She was transferred to [facility] and underwent dual-chamber pacemaker implantation on 06/06/2021.  She was last seen in the office in 09/2021 and was without symptoms of angina or decompensation.  Given prior noted intolerance of metoprolol with low normal BP, escalation of GDMT was deferred.  She comes in doing very well from a cardiac perspective, and is without symptoms of angina or decompensation.  She is walking on a regular basis, up to one third of a mile without cardiac limitation.  She does note some lightheadedness if she changes positions quickly.  However, she has not had any further dizziness.  No syncope.  Her weight remains stable.  Overall, she feels like she is doing very well and does not have any active cardiac issues or concerns at this time   Labs independently reviewed: 11/2021 - TC 229, TG 155, HDL 53, LDL 144, potassium 4.4, BUN 19, serum creatinine 0.7, albumin 4.0, AST/ALT normal 05/2021 - TSH normal, Hgb 14.9, PLT 259, magnesium 2.4  Past Medical History:  Diagnosis Date   CAD (coronary artery disease)    Nonobstructive CAD   Complete heart block ([place]) 05/2021   s/p PPM   HFrEF (heart failure with reduced ejection fraction) ([place])    Hyperlipidemia LDL goal <70    Takotsubo cardiomyopathy    Thyroid disease     Past Surgical History:  Procedure Laterality Date   COLONOSCOPY  2013   cleared for 10 yrs- Dr @ [place] CATH AND CORONARY ANGIOGRAPHY N/A 03/04/2021   Procedure: LEFT HEART CATH AND CORONARY ANGIOGRAPHY;  Surgeon: Nelva Bush, MD;  Location: [facility];  Service: Cardiovascular;  Laterality: N/A;   PACEMAKER IMPLANT N/A 06/06/2021   Procedure: PACEMAKER IMPLANT;  Surgeon: Vickie Epley, MD;  Location: [facility];  Service: Cardiovascular;  Laterality: N/A;    Current Medications: Current Meds   Medication Sig   aspirin 81 MG EC tablet Take 1 tablet (81 mg total) by mouth daily. Swallow whole.   Calcium Citrate-Vitamin D (CALCIUM CITRATE +D PO) Take 1 tablet by mouth every morning.   Cholecalciferol (VITAMIN D3 PO) Take 1 tablet by mouth every morning.   Multiple Vitamin (MULTIVITAMIN WITH MINERALS) TABS tablet Take 1 tablet by mouth every morning.   SYNTHROID 137 MCG tablet Take 68.5 mcg by mouth at bedtime.    Allergies:   Patient has no known allergies.   Social History   Socioeconomic History   Marital status: Married    Spouse name: Not on file   Number of children: Not on file   Years of education: Not on file   Highest education level: Not on file  Occupational History   Not on file  Tobacco Use   Smoking status: Never   Smokeless tobacco: Never  Vaping Use   Vaping Use: Never used  Substance and Sexual Activity   Alcohol use: No    Alcohol/week: 0.0 standard drinks of alcohol   Drug use: No   Sexual activity: Not Currently  Other Topics Concern   Not on file  Social History Narrative   Not on file   Social  Determinants of Health   Financial Resource Strain: Not on file  Food Insecurity: Not on file  Transportation Needs: Not on file  Physical Activity: Not on file  Stress: Not on file  Social Connections: Not on file     Family History:  The patient's family history includes Heart disease in her father.  ROS:   12-point review of systems is negative unless otherwise noted in the HPI.   EKGs/Labs/Other Studies Reviewed:    Studies reviewed were summarized above. The additional studies were reviewed today:  LHC 03/04/2021: Conclusions: Moderate single-vessel coronary artery disease with 50 to 60% proximal LAD stenosis. Severely reduced LVEF with mid and apical hypokinesis/akinesis and hyperdynamic basilar contraction.  Findings are consistent with stress-induced cardiomyopathy. Moderately-severely elevated left ventricular filling pressure  (LVEDP ~35 mmHg).   Recommendations: Admit to ICU for monitoring and medical optimization. Gentle diuresis. Add low-dose metoprolol as blood pressure tolerates. If blood pressure and renal function allow, consider addition of ACE inhibitor/ARB as soon as tomorrow. Aspirin and statin therapy to prevent progression of LAD disease. Obtain echocardiogram tomorrow with particular attention to the LV apex, as the patient is at risk for thrombus formation. __________   2D echo 03/05/2021: 1. Findings consistent with Takatsubo stress induced DCM. Akinesis of mid  and apical walls with hyperdyanamic basal function No mural apical  thrombus with or without definity . Left ventricular ejection fraction, by  estimation, is 25 to 30%. The left  ventricle has severely decreased function. The left ventricle has no  regional wall motion abnormalities. Left ventricular diastolic parameters  are consistent with Grade I diastolic dysfunction (impaired relaxation).   2. Right ventricular systolic function is normal. The right ventricular  size is normal.   3. The pericardial effusion is surrounding the apex and anterior to the  right ventricle.   4. The mitral valve is abnormal. Trivial mitral valve regurgitation. No  evidence of mitral stenosis.   5. The aortic valve is tricuspid. There is mild calcification of the  aortic valve. Aortic valve regurgitation is not visualized. Mild aortic  valve sclerosis is present, with no evidence of aortic valve stenosis.   6. The inferior vena cava is normal in size with greater than 50%  respiratory variability, suggesting right atrial pressure of 3 mmHg. __________   Limited echo 04/20/2021: 1. Left ventricular ejection fraction, by estimation, is 55 to 60%. The  left ventricle has normal function. The left ventricle has no regional  wall motion abnormalities.   2. Right ventricular systolic function is normal. The right ventricular  size is normal.   3. The  aortic valve was not well visualized. Aortic valve regurgitation  is not visualized.  __________   Elwyn Reach patch 04/2021: HR 21 - 115, average 85bpm. 1151 pauses, longest lasting 10 seconds. High degree AV block was present. Rare supraventricular and ventricular ectopy.   EKG:  EKG is ordered today.  The EKG ordered today demonstrates A-sensed, V-paced, 84 bpm  Recent Labs: 06/05/2021: B Natriuretic Peptide 80.7 06/06/2021: BUN 21; Creatinine, Ser 0.64; Hemoglobin 14.9; Magnesium 2.4; Platelets 259; Potassium 3.9; Sodium 139  Recent Lipid Panel    Component Value Date/Time   CHOL 234 (H) 03/04/2021 1613   CHOL 227 (H) 03/10/2016 0827   TRIG 157 (H) 03/04/2021 1613   HDL 56 03/04/2021 1613   HDL 65 03/10/2016 0827   CHOLHDL 4.2 03/04/2021 1613   VLDL 31 03/04/2021 1613   LDLCALC 147 (H) 03/04/2021 1613   LDLCALC 144 (H)  03/10/2016 0827    PHYSICAL EXAM:    VS:  BP (!) 100/54 (BP Location: Left Arm, Patient Position: Sitting, Cuff Size: Normal)   Pulse 84   Ht '5\' 2"'$  (1.575 m)   Wt 120 lb (54.4 kg)   SpO2 98%   BMI 21.95 kg/m   BMI: Body mass index is 21.95 kg/m.  Physical Exam Vitals reviewed.  Constitutional:      Appearance: She is well-developed.  HENT:     Head: Normocephalic and atraumatic.  Eyes:     General:        Right eye: No discharge.        Left eye: No discharge.  Cardiovascular:     Rate and Rhythm: Normal rate and regular rhythm.     Heart sounds: Normal heart sounds, S1 normal and S2 normal. Heart sounds not distant. No midsystolic click and no opening snap. No murmur heard.    No friction rub.  Pulmonary:     Effort: Pulmonary effort is normal. No respiratory distress.     Breath sounds: Normal breath sounds. No decreased breath sounds, wheezing or rales.  Chest:     Chest wall: No tenderness.  Abdominal:     General: There is no distension.  Musculoskeletal:     Cervical back: Normal range of motion.  Skin:    General: Skin is warm and dry.      Nails: There is no clubbing.  Neurological:     Mental Status: She is alert and oriented to person, place, and time.  Psychiatric:        Speech: Speech normal.        Behavior: Behavior normal.        Thought Content: Thought content normal.        Judgment: Judgment normal.     Wt Readings from Last 3 Encounters:  03/30/22 120 lb (54.4 kg)  09/29/21 120 lb (54.4 kg)  09/14/21 123 lb (55.8 kg)     ASSESSMENT & PLAN:   Stress-induced cardiomyopathy: She is doing very well and appears euvolemic and well compensated with NYHA class I symptoms.  Echo has demonstrated normalization of LV systolic function.  She is currently off GDMT due to noted lightheadedness/dizziness on metoprolol.  Relative hypotension, and normalization of LV systolic function preclude escalation of GDMT.  No further work-up is indicated at this time.  Complete heart block: Status post PPM.  Follow-up with EP and device clinic as indicated.  Nonobstructive CAD/HLD: No symptoms suggestive of angina.  She continues to live an active lifestyle.  Atorvastatin was previously discontinued by neurology.  If LDL remains above target, despite lifestyle modification, would favor rechallenging with statin.  This will be deferred to patient's PCP/neurology.   Disposition: F/u with Dr. Saunders Revel or an APP in 6 months.   Medication Adjustments/Labs and Tests Ordered: Current medicines are reviewed at length with the patient today.  Concerns regarding medicines are outlined above. Medication changes, Labs and Tests ordered today are summarized above and listed in the Patient Instructions accessible in Encounters.   Signed, Christell Faith, PA-C 03/30/2022 12:21 PM     [place] [address] [place], [place] [address] [phone]

## 2022-04-04 ENCOUNTER — Other Ambulatory Visit: Payer: Self-pay | Admitting: *Deleted

## 2022-04-04 NOTE — Patient Outreach (Signed)
  Care Coordination   04/04/2022 Name: Lori Rosario MRN: [id number] DOB: 18-Jan-1941   Care Coordination Outreach Attempts:  An unsuccessful telephone outreach was attempted today to offer the patient information about available care coordination services as a benefit of their health plan.   Follow Up Plan:  Additional outreach attempts will be made to offer the patient care coordination information and services.   Encounter Outcome:  No Answer  Care Coordination Interventions Activated:  Yes   Care Coordination Interventions:  No, not indicated    [facility] [facility] [phone]

## 2022-04-14 ENCOUNTER — Encounter: Payer: Self-pay | Admitting: Cardiology

## 2022-04-14 NOTE — Telephone Encounter (Signed)
error 

## 2022-06-06 ENCOUNTER — Ambulatory Visit (INDEPENDENT_AMBULATORY_CARE_PROVIDER_SITE_OTHER): Payer: PPO

## 2022-06-06 DIAGNOSIS — I442 Atrioventricular block, complete: Secondary | ICD-10-CM

## 2022-06-06 LAB — CUP PACEART REMOTE DEVICE CHECK
Date Time Interrogation Session: 20231010084736
Implantable Lead Implant Date: 20221010
Implantable Lead Implant Date: 20221010
Implantable Lead Location: 753859
Implantable Lead Location: 753860
Implantable Lead Model: 377171
Implantable Lead Model: 377171
Implantable Lead Serial Number: 8000510718
Implantable Lead Serial Number: 8000555079
Implantable Pulse Generator Implant Date: 20221010
Pulse Gen Model: 407145
Pulse Gen Serial Number: 70231665

## 2022-06-09 DIAGNOSIS — E890 Postprocedural hypothyroidism: Secondary | ICD-10-CM | POA: Diagnosis not present

## 2022-06-09 DIAGNOSIS — E7800 Pure hypercholesterolemia, unspecified: Secondary | ICD-10-CM | POA: Diagnosis not present

## 2022-06-14 ENCOUNTER — Ambulatory Visit: Payer: PPO | Attending: Cardiology | Admitting: Cardiology

## 2022-06-14 ENCOUNTER — Encounter: Payer: Self-pay | Admitting: Cardiology

## 2022-06-14 VITALS — BP 124/70 | HR 83 | Ht 62.0 in | Wt 121.6 lb

## 2022-06-14 DIAGNOSIS — Z950 Presence of cardiac pacemaker: Secondary | ICD-10-CM | POA: Diagnosis not present

## 2022-06-14 DIAGNOSIS — I442 Atrioventricular block, complete: Secondary | ICD-10-CM | POA: Diagnosis not present

## 2022-06-14 LAB — CUP PACEART INCLINIC DEVICE CHECK
Date Time Interrogation Session: 20231018091431
Implantable Lead Implant Date: 20221010
Implantable Lead Implant Date: 20221010
Implantable Lead Location: 753859
Implantable Lead Location: 753860
Implantable Lead Model: 377171
Implantable Lead Model: 377171
Implantable Lead Serial Number: 8000510718
Implantable Lead Serial Number: 8000555079
Implantable Pulse Generator Implant Date: 20221010
Pulse Gen Model: 407145
Pulse Gen Serial Number: 70231665

## 2022-06-14 NOTE — Progress Notes (Signed)
Electrophysiology Office Follow up Visit Note:    Date:  06/14/2022   ID:  Lori Rosario, DOB [birth year], MRN [id number]  PCP:  Sofie Hartigan, MD  [facility] Cardiologist:  Nelva Bush, MD  [facility] Electrophysiologist:  Vickie Epley, MD    Interval History:    Lori Rosario is a 81 y.o. female who presents for a follow up visit. They were last seen in clinic September 14, 2021 after pacemaker was implanted for complete heart block on June 06, 2021.  Remote interrogations since implant have shown stable device function.       Past Medical History:  Diagnosis Date   CAD (coronary artery disease)    Nonobstructive CAD   Complete heart block ([place]) 05/2021   s/p PPM   HFrEF (heart failure with reduced ejection fraction) ([place])    Hyperlipidemia LDL goal <70    Takotsubo cardiomyopathy    Thyroid disease     Past Surgical History:  Procedure Laterality Date   COLONOSCOPY  2013   cleared for 10 yrs- Dr @ [place]     LEFT HEART CATH AND CORONARY ANGIOGRAPHY N/A 03/04/2021   Procedure: LEFT HEART CATH AND CORONARY ANGIOGRAPHY;  Surgeon: Nelva Bush, MD;  Location: [place] [facility];  Service: Cardiovascular;  Laterality: N/A;   PACEMAKER IMPLANT N/A 06/06/2021   Procedure: PACEMAKER IMPLANT;  Surgeon: Vickie Epley, MD;  Location: [place] [facility];  Service: Cardiovascular;  Laterality: N/A;    Current Medications: Current Meds  Medication Sig   aspirin 81 MG EC tablet Take 1 tablet (81 mg total) by mouth daily. Swallow whole.   Calcium Citrate-Vitamin D (CALCIUM CITRATE +D PO) Take 1 tablet by mouth every morning.   Cholecalciferol (VITAMIN D3 PO) Take 1 tablet by mouth every morning.   Multiple Vitamin (MULTIVITAMIN WITH MINERALS) TABS tablet Take 1 tablet by mouth every morning.   SYNTHROID 137 MCG tablet Take 68.5 mcg by mouth at bedtime.     Allergies:   Patient has no known allergies.   Social History    Socioeconomic History   Marital status: Married    Spouse name: Not on file   Number of children: Not on file   Years of education: Not on file   Highest education level: Not on file  Occupational History   Not on file  Tobacco Use   Smoking status: Never   Smokeless tobacco: Never  Vaping Use   Vaping Use: Never used  Substance and Sexual Activity   Alcohol use: No    Alcohol/week: 0.0 standard drinks of alcohol   Drug use: No   Sexual activity: Not Currently  Other Topics Concern   Not on file  Social History Narrative   Not on file   Social Determinants of Health   Financial Resource Strain: Not on file  Food Insecurity: Not on file  Transportation Needs: Not on file  Physical Activity: Not on file  Stress: Not on file  Social Connections: Not on file     Family History: The patient's family history includes Heart disease in her father.  ROS:   Please see the history of present illness.    All other systems reviewed and are negative.  EKGs/Labs/Other Studies Reviewed:    The following studies were reviewed today:  June 14, 2022 in clinic device interrogation personally reviewed Battery longevity 8 years 10 months Lead parameters are stable Atrial pacing 0%, ventricular pacing 100%  Reprogrammed device to optimize intrinsic conduction and minimize ventricular pacing.  Once this was done, the patient stopped ventricular pacing    Recent Labs: No results found for requested labs within last 365 days.  Recent Lipid Panel    Component Value Date/Time   CHOL 234 (H) 03/04/2021 1613   CHOL 227 (H) 03/10/2016 0827   TRIG 157 (H) 03/04/2021 1613   HDL 56 03/04/2021 1613   HDL 65 03/10/2016 0827   CHOLHDL 4.2 03/04/2021 1613   VLDL 31 03/04/2021 1613   LDLCALC 147 (H) 03/04/2021 1613   LDLCALC 144 (H) 03/10/2016 0827    Physical Exam:    VS:  BP 124/70   Pulse 83   Ht '5\' 2"'$  (1.575 m)   Wt 121 lb 9.6 oz (55.2 kg)   SpO2 96%   BMI 22.24 kg/m      Wt Readings from Last 3 Encounters:  06/14/22 121 lb 9.6 oz (55.2 kg)  03/30/22 120 lb (54.4 kg)  09/29/21 120 lb (54.4 kg)     GEN:  Well nourished, well developed in no acute distress HEENT: Normal NECK: No JVD; No carotid bruits LYMPHATICS: No lymphadenopathy CARDIAC: RRR, no murmurs, rubs, gallops.  Pacemaker pocket well-healed RESPIRATORY:  Clear to auscultation without rales, wheezing or rhonchi  ABDOMEN: Soft, non-tender, non-distended MUSCULOSKELETAL:  No edema; No deformity  SKIN: Warm and dry NEUROLOGIC:  Alert and oriented x 3 PSYCHIATRIC:  Normal affect        ASSESSMENT:    1. Complete heart block (HCC)   2. Cardiac pacemaker in situ    PLAN:    In order of problems listed above:    #Complete heart block #Pacemaker in situ Device functioning appropriately.  Reprogrammed device today to minimize ventricular pacing.  Continue remote monitoring.    Follow-up 1 year with APP.     Medication Adjustments/Labs and Tests Ordered: Current medicines are reviewed at length with the patient today.  Concerns regarding medicines are outlined above.  Orders Placed This Encounter  Procedures   CUP [place]   No orders of the defined types were placed in this encounter.    Signed, Lars Mage, MD, [facility], [facility] 06/14/2022 12:58 PM    Electrophysiology [facility]

## 2022-06-14 NOTE — Patient Instructions (Signed)
Medication Instructions:  None  *If you need a refill on your cardiac medications before your next appointment, please call your pharmacy*   Lab Work: None  If you have labs (blood work) drawn today and your tests are completely normal, you will receive your results only by: MyChart Message (if you have MyChart) OR A paper copy in the mail If you have any lab test that is abnormal or we need to change your treatment, we will call you to review the results.   Testing/Procedures: None    Follow-Up: At [facility], you and your health needs are our priority.  As part of our continuing mission to provide you with exceptional heart care, we have created designated Provider Care Teams.  These Care Teams include your primary Cardiologist (physician) and Advanced Practice Providers (APPs -  Physician Assistants and Nurse Practitioners) who all work together to provide you with the care you need, when you need it.  We recommend signing up for the patient portal called "MyChart".  Sign up information is provided on this After Visit Summary.  MyChart is used to connect with patients for Virtual Visits (Telemedicine).  Patients are able to view lab/test results, encounter notes, upcoming appointments, etc.  Non-urgent messages can be sent to your provider as well.   To learn more about what you can do with MyChart, go to [email or web address].    Your next appointment:   1 year(s)  The format for your next appointment:   In Person  Provider:   You will see one of the following Advanced Practice Providers on your designated Care Team:   Christopher Berge, NP Ryan Dunn, PA-C Cadence Furth, PA-C Sheri Hammock, NP      Other Instructions None   Important Information About Sugar       

## 2022-06-16 DIAGNOSIS — E7800 Pure hypercholesterolemia, unspecified: Secondary | ICD-10-CM | POA: Diagnosis not present

## 2022-06-16 DIAGNOSIS — I5032 Chronic diastolic (congestive) heart failure: Secondary | ICD-10-CM | POA: Diagnosis not present

## 2022-06-16 DIAGNOSIS — E890 Postprocedural hypothyroidism: Secondary | ICD-10-CM | POA: Diagnosis not present

## 2022-06-16 DIAGNOSIS — I442 Atrioventricular block, complete: Secondary | ICD-10-CM | POA: Diagnosis not present

## 2022-06-16 DIAGNOSIS — Z0000 Encounter for general adult medical examination without abnormal findings: Secondary | ICD-10-CM | POA: Diagnosis not present

## 2022-06-16 DIAGNOSIS — Z23 Encounter for immunization: Secondary | ICD-10-CM | POA: Diagnosis not present

## 2022-06-20 NOTE — Progress Notes (Signed)
Remote pacemaker transmission.   

## 2022-06-27 ENCOUNTER — Telehealth: Payer: Self-pay | Admitting: *Deleted

## 2022-06-27 NOTE — Patient Outreach (Signed)
  Care Coordination   Initial Visit Note   06/27/2022 Name: LINSIE LUPO MRN: [id number] DOB: [birth year]  MCKENZIE BOVE is a 81 y.o. year old female who sees Feldpausch, Chrissie Noa, MD for primary care. I spoke with  Nicola Police by phone today.  What matters to the patients health and wellness today?  Member state she is doing well, declines to participate in program.  Agrees to receive program information for future purposes if needed.     SDOH assessments and interventions completed:  No     Care Coordination Interventions Activated:  No  Care Coordination Interventions:  No, not indicated   Follow up plan: No further intervention required.   Encounter Outcome:  Pt. Visit Completed   Valente David, RN, MSN, [facility] Management Care Management Coordinator [phone]

## 2022-06-29 LAB — PACEMAKER DEVICE OBSERVATION

## 2022-06-30 ENCOUNTER — Other Ambulatory Visit: Payer: Self-pay | Admitting: Physician Assistant

## 2022-09-05 ENCOUNTER — Ambulatory Visit (INDEPENDENT_AMBULATORY_CARE_PROVIDER_SITE_OTHER): Payer: PPO

## 2022-09-05 DIAGNOSIS — I442 Atrioventricular block, complete: Secondary | ICD-10-CM | POA: Diagnosis not present

## 2022-09-05 LAB — CUP PACEART REMOTE DEVICE CHECK
Date Time Interrogation Session: 20240109091047
Implantable Lead Connection Status: 753985
Implantable Lead Connection Status: 753985
Implantable Lead Implant Date: 20221010
Implantable Lead Implant Date: 20221010
Implantable Lead Location: 753859
Implantable Lead Location: 753860
Implantable Lead Model: 377171
Implantable Lead Model: 377171
Implantable Lead Serial Number: 8000510718
Implantable Lead Serial Number: 8000555079
Implantable Pulse Generator Implant Date: 20221010
Pulse Gen Model: 407145
Pulse Gen Serial Number: 70231665

## 2022-09-27 NOTE — Progress Notes (Deleted)
Cardiology Office Note    Date:  09/27/2022   ID:  Lori Rosario, Lori Rosario 01/26/1941, MRN [id number]  PCP:  Sofie Hartigan, MD  Cardiologist:  Nelva Bush, MD  Electrophysiologist:  Vickie Epley, MD   Chief Complaint: Follow up  History of Present Illness:   Lori Rosario is a 82 y.o. female with history of CAD, HFrEF secondary to stress-induced cardiomyopathy with normalization of LV systolic function by echo in 03/2021, syncope with complete heart block status post PPM on 06/06/2021, hyperthyroidism status post radioactive iodine therapy in her 88s now on replacement therapy, and HLD who presents for follow-up of her cardiomyopathy.   Prior to her admission in 02/2021 she did not have any known cardiac disease.  Initially, she presented to an urgent care with right otalgia.  Prior to leaving her appointment she developed substernal chest pain without radiation or associated symptoms.  EKG showed inferior lateral ST elevation.  In this setting she was transferred to [facility] ED.  She underwent emergent LHC which showed moderate single-vessel CAD with 50 to 60% proximal LAD stenosis, severely reduced LVEF with mid and apical hypokinesis/akinesis and hyperdynamic basal contraction with findings consistent with stress-induced cardiomyopathy, moderately to severely elevated LVEDP of approximately 35 mmHg.  Medical management and diuresis was recommended.  Echo on 03/05/2021 showed an EF of 25 to 30%, akinesis of mid and apical walls with hyperdynamic basal function with findings consistent with Takotsubo dilated cardiomyopathy, grade 1 diastolic dysfunction, normal RV systolic function and ventricular cavity size, a pericardial effusion surrounding the apex and anterior to the right ventricle was noted, trivial mitral regurgitation, mild aortic valve sclerosis without evidence of stenosis, and no evidence of mural apical thrombus, with or without Definity.  High-sensitivity troponin peaked at 4462.   She was discharged on low-dose Toprol-XL and as needed Lasix.  Relative hypotension precluded escalation of further GDMT.     She was seen in hospital follow-up on 04/06/2021 and was doing very well from a cardiac perspective.  She was ambulating one third of a mile per day without issues.  She was tolerating medications.  Repeat limited echo on 04/20/2021 showed normalization of LV systolic function with an EF of 55 to 60%, no regional wall motion abnormalities, normal RV systolic function and ventricular cavity size, and no significant valvular abnormalities.  Following her office visit in 03/2021, metoprolol was discontinued as there was concern this was contributing to her dizziness.   She was evaluated in the ED on 05/06/2021 with persistent dizziness.  Laboratory evaluation was unrevealing including high-sensitivity troponin.  MRI brain was nonacute.  EKG demonstrated sinus rhythm with known first-degree AV block.  Treated with a dose of meclizine with improvement in symptoms.  She was evaluated by ENT with symptoms felt to be related to vertigo.   She was seen in the office in 04/2021 and continued to note intermittent dizziness described as a room spinning sensation.  She reported meclizine had led to resolution of these episodes.  Home BP/HR log showed BP readings from 95-148/59-78 with heart rates ranging from 56 to 101 bpm with the majority of her BP readings in the [id number] to [id number] systolic and heart rates in the 80s to 90s bpm.  Given symptoms, she underwent outpatient cardiac monitoring, which demonstrated two episodes of complete heart block with heart rates ranging between 34 and 37 bpm with episodes persisting approximately 7 to 8 seconds.  There were 4 episodes of high-grade AV block.  Upon  our office contacting the patient, she had not had any further syncope.  She reported her neurologist had recommended she discontinue atorvastatin, and with this her dizziness had improved.  She was referred to EP.   However, prior to this appointment, she was admitted to the hospital in 05/2021 with syncope.  She was transferred to [facility] and underwent dual-chamber pacemaker implantation on 06/06/2021.   She was seen in the office in 09/2021 and was without symptoms of angina or decompensation.  Given prior noted intolerance of metoprolol with low normal BP, escalation of GDMT was deferred.  I last saw her in 03/2022, at which time she remained without symptoms of angina or cardiac decompensation.  She was walking on a regular basis, up to one third of a mile without cardiac limitation.  She did note some positional and remained without further syncope.  She was seen by EP in 05/2022 decompensation with recommendation to follow-up with them in 1 year.  ***   Labs independently reviewed: 05/2022 - TC 234, TG 144, HDL 56, LDL 149, potassium 4.7, BUN 19, SCr 0.7, albumin 3.9, AST/ALT normal, TSH normal 11/2021 - Hgb 14.9, PLT 259, magnesium 2.4  Past Medical History:  Diagnosis Date   CAD (coronary artery disease)    Nonobstructive CAD   Complete heart block ([place]) 05/2021   s/p PPM   HFrEF (heart failure with reduced ejection fraction) ([place])    Hyperlipidemia LDL goal <70    Takotsubo cardiomyopathy    Thyroid disease     Past Surgical History:  Procedure Laterality Date   COLONOSCOPY  2013   cleared for 10 yrs- Dr @ [place]     LEFT HEART CATH AND CORONARY ANGIOGRAPHY N/A 03/04/2021   Procedure: LEFT HEART CATH AND CORONARY ANGIOGRAPHY;  Surgeon: Nelva Bush, MD;  Location: [place] [facility];  Service: Cardiovascular;  Laterality: N/A;   PACEMAKER IMPLANT N/A 06/06/2021   Procedure: PACEMAKER IMPLANT;  Surgeon: Vickie Epley, MD;  Location: [place] [facility];  Service: Cardiovascular;  Laterality: N/A;    Current Medications: No outpatient medications have been marked as taking for the 10/03/22 encounter (Appointment) with Rise Mu, PA-C.    Allergies:    Patient has no known allergies.   Social History   Socioeconomic History   Marital status: Married    Spouse name: Not on file   Number of children: Not on file   Years of education: Not on file   Highest education level: Not on file  Occupational History   Not on file  Tobacco Use   Smoking status: Never   Smokeless tobacco: Never  Vaping Use   Vaping Use: Never used  Substance and Sexual Activity   Alcohol use: No    Alcohol/week: 0.0 standard drinks of alcohol   Drug use: No   Sexual activity: Not Currently  Other Topics Concern   Not on file  Social History Narrative   Not on file   Social Determinants of Health   Financial Resource Strain: Not on file  Food Insecurity: Not on file  Transportation Needs: Not on file  Physical Activity: Not on file  Stress: Not on file  Social Connections: Not on file     Family History:  The patient's family history includes Heart disease in her father.  ROS:   12-point review of systems is negative unless otherwise noted in the HPI.   EKGs/Labs/Other Studies Reviewed:    Studies reviewed  were summarized above. The additional studies were reviewed today:  LHC 03/04/2021: Conclusions: Moderate single-vessel coronary artery disease with 50 to 60% proximal LAD stenosis. Severely reduced LVEF with mid and apical hypokinesis/akinesis and hyperdynamic basilar contraction.  Findings are consistent with stress-induced cardiomyopathy. Moderately-severely elevated left ventricular filling pressure (LVEDP ~35 mmHg).   Recommendations: Admit to ICU for monitoring and medical optimization. Gentle diuresis. Add low-dose metoprolol as blood pressure tolerates. If blood pressure and renal function allow, consider addition of ACE inhibitor/ARB as soon as tomorrow. Aspirin and statin therapy to prevent progression of LAD disease. Obtain echocardiogram tomorrow with particular attention to the LV apex, as the patient is at risk for thrombus  formation. __________   2D echo 03/05/2021: 1. Findings consistent with Takatsubo stress induced DCM. Akinesis of mid  and apical walls with hyperdyanamic basal function No mural apical  thrombus with or without definity . Left ventricular ejection fraction, by  estimation, is 25 to 30%. The left  ventricle has severely decreased function. The left ventricle has no  regional wall motion abnormalities. Left ventricular diastolic parameters  are consistent with Grade I diastolic dysfunction (impaired relaxation).   2. Right ventricular systolic function is normal. The right ventricular  size is normal.   3. The pericardial effusion is surrounding the apex and anterior to the  right ventricle.   4. The mitral valve is abnormal. Trivial mitral valve regurgitation. No  evidence of mitral stenosis.   5. The aortic valve is tricuspid. There is mild calcification of the  aortic valve. Aortic valve regurgitation is not visualized. Mild aortic  valve sclerosis is present, with no evidence of aortic valve stenosis.   6. The inferior vena cava is normal in size with greater than 50%  respiratory variability, suggesting right atrial pressure of 3 mmHg. __________   Limited echo 04/20/2021: 1. Left ventricular ejection fraction, by estimation, is 55 to 60%. The  left ventricle has normal function. The left ventricle has no regional  wall motion abnormalities.   2. Right ventricular systolic function is normal. The right ventricular  size is normal.   3. The aortic valve was not well visualized. Aortic valve regurgitation  is not visualized.  __________   Elwyn Reach patch 04/2021: HR 21 - 115, average 85bpm. 1151 pauses, longest lasting 10 seconds. High degree AV block was present. Rare supraventricular and ventricular ectopy.   EKG:  EKG is ordered today.  The EKG ordered today demonstrates ***  Recent Labs: No results found for requested labs within last 365 days.  Recent Lipid Panel     Component Value Date/Time   CHOL 234 (H) 03/04/2021 1613   CHOL 227 (H) 03/10/2016 0827   TRIG 157 (H) 03/04/2021 1613   HDL 56 03/04/2021 1613   HDL 65 03/10/2016 0827   CHOLHDL 4.2 03/04/2021 1613   VLDL 31 03/04/2021 1613   LDLCALC 147 (H) 03/04/2021 1613   LDLCALC 144 (H) 03/10/2016 0827    PHYSICAL EXAM:    VS:  There were no vitals taken for this visit.  BMI: There is no height or weight on file to calculate BMI.  Physical Exam  Wt Readings from Last 3 Encounters:  06/14/22 121 lb 9.6 oz (55.2 kg)  03/30/22 120 lb (54.4 kg)  09/29/21 120 lb (54.4 kg)     ASSESSMENT & PLAN:   Stress-induced cardiomyopathy:   Complete heart block: Status post PPM.  Nonobstructive CAD/HLD:   {Are you ordering a CV Procedure (e.g. stress test, cath,  DCCV, TEE, etc)?   Press F2        :[id number]     Disposition: F/u with Dr. Saunders Revel or an APP in ***, and EP as directed.    Medication Adjustments/Labs and Tests Ordered: Current medicines are reviewed at length with the patient today.  Concerns regarding medicines are outlined above. Medication changes, Labs and Tests ordered today are summarized above and listed in the Patient Instructions accessible in Encounters.   Signed, Christell Faith, PA-C 09/27/2022 3:46 PM     [facility] [address] [place], [place] [address] [phone]

## 2022-09-29 ENCOUNTER — Ambulatory Visit: Payer: PPO | Admitting: Physician Assistant

## 2022-09-29 NOTE — Progress Notes (Signed)
Remote pacemaker transmission.   

## 2022-10-02 ENCOUNTER — Telehealth: Payer: Self-pay

## 2022-10-02 NOTE — Telephone Encounter (Signed)
Details for arrhythmia episode received (all types) Details were received for a AT episode, which was detected on Sep 30, 2022, 9:28:26 PM  Patient had a brief 58second episode of atrial flutter episode.  Ventricular rates were controlled. Flagging just as an FYI as patient has no hx of prior atrial events.  Will continue to monitor.

## 2022-10-03 ENCOUNTER — Ambulatory Visit: Payer: PPO | Admitting: Physician Assistant

## 2022-10-03 DIAGNOSIS — I5181 Takotsubo syndrome: Secondary | ICD-10-CM

## 2022-10-03 DIAGNOSIS — E785 Hyperlipidemia, unspecified: Secondary | ICD-10-CM

## 2022-10-03 DIAGNOSIS — Z950 Presence of cardiac pacemaker: Secondary | ICD-10-CM

## 2022-10-03 DIAGNOSIS — I442 Atrioventricular block, complete: Secondary | ICD-10-CM

## 2022-10-03 DIAGNOSIS — I2510 Atherosclerotic heart disease of native coronary artery without angina pectoris: Secondary | ICD-10-CM

## 2022-10-10 ENCOUNTER — Encounter: Payer: Self-pay | Admitting: Physician Assistant

## 2022-10-10 ENCOUNTER — Ambulatory Visit: Payer: PPO | Attending: Physician Assistant | Admitting: Physician Assistant

## 2022-10-10 VITALS — BP 128/60 | HR 87 | Ht 62.0 in | Wt 121.0 lb

## 2022-10-10 DIAGNOSIS — I5181 Takotsubo syndrome: Secondary | ICD-10-CM | POA: Diagnosis not present

## 2022-10-10 DIAGNOSIS — I442 Atrioventricular block, complete: Secondary | ICD-10-CM

## 2022-10-10 DIAGNOSIS — E785 Hyperlipidemia, unspecified: Secondary | ICD-10-CM | POA: Diagnosis not present

## 2022-10-10 DIAGNOSIS — Z950 Presence of cardiac pacemaker: Secondary | ICD-10-CM

## 2022-10-10 DIAGNOSIS — I2510 Atherosclerotic heart disease of native coronary artery without angina pectoris: Secondary | ICD-10-CM | POA: Diagnosis not present

## 2022-10-10 NOTE — Progress Notes (Signed)
Cardiology Office Note    Date:  10/10/2022   ID:  Rosario, Lori [birth year], MRN [id number]  PCP:  Sofie Hartigan, MD  Cardiologist:  Nelva Bush, MD  Electrophysiologist:  Vickie Epley, MD   Chief Complaint: Follow-up  History of Present Illness:   Lori Rosario is a 82 y.o. female with history of CAD, HFrEF secondary to stress-induced cardiomyopathy with normalization of LV systolic function by echo in 03/2021, syncope with complete heart block status post PPM on 06/06/2021, hyperthyroidism status post radioactive iodine therapy in her 66s now on replacement therapy, and HLD who presents for follow-up of her cardiomyopathy.   Prior to her admission in 02/2021 she did not have any known cardiac disease.  Initially, she presented to an urgent care with right otalgia.  Prior to leaving her appointment she developed substernal chest pain without radiation or associated symptoms.  EKG showed inferior lateral ST elevation.  In this setting she was transferred to [facility] ED.  She underwent emergent LHC which showed moderate single-vessel CAD with 50 to 60% proximal LAD stenosis, severely reduced LVEF with mid and apical hypokinesis/akinesis and hyperdynamic basal contraction with findings consistent with stress-induced cardiomyopathy, moderately to severely elevated LVEDP of approximately 35 mmHg.  Medical management and diuresis was recommended.  Echo on 03/05/2021 showed an EF of 25 to 30%, akinesis of mid and apical walls with hyperdynamic basal function with findings consistent with Takotsubo dilated cardiomyopathy, grade 1 diastolic dysfunction, normal RV systolic function and ventricular cavity size, a pericardial effusion surrounding the apex and anterior to the right ventricle was noted, trivial mitral regurgitation, mild aortic valve sclerosis without evidence of stenosis, and no evidence of mural apical thrombus, with or without Definity.  High-sensitivity troponin peaked at 4462.   She was discharged on low-dose Toprol-XL and as needed Lasix.  Relative hypotension precluded escalation of further GDMT.     She was seen in hospital follow-up on 04/06/2021 and was doing very well from a cardiac perspective.  She was ambulating one third of a mile per day without issues.  She was tolerating medications.  Repeat limited echo on 04/20/2021 showed normalization of LV systolic function with an EF of 55 to 60%, no regional wall motion abnormalities, normal RV systolic function and ventricular cavity size, and no significant valvular abnormalities.  Following her office visit in 03/2021, metoprolol was discontinued as there was concern this was contributing to her dizziness.   She was evaluated in the ED on 05/06/2021 with persistent dizziness.  Laboratory evaluation was unrevealing including high-sensitivity troponin.  MRI brain was nonacute.  EKG demonstrated sinus rhythm with known first-degree AV block.  Treated with a dose of meclizine with improvement in symptoms.  She was evaluated by ENT with symptoms felt to be related to vertigo.   She was seen in the office in 04/2021 and continued to note intermittent dizziness described as a room spinning sensation.  She reported meclizine had led to resolution of these episodes.  Home BP/HR log showed BP readings from 95-148/59-78 with heart rates ranging from 56 to 101 bpm with the majority of her BP readings in the [id number] to [id number] systolic and heart rates in the 80s to 90s bpm.  Given symptoms, she underwent outpatient cardiac monitoring, which demonstrated two episodes of complete heart block with heart rates ranging between 34 and 37 bpm with episodes persisting approximately 7 to 8 seconds.  There were 4 episodes of high-grade AV block.  Upon our  office contacting the patient, she had not had any further syncope.  She reported her neurologist had recommended she discontinue atorvastatin, and with this her dizziness had improved.  She was referred to EP.   However, prior to this appointment, she was admitted to the hospital in 05/2021 with syncope.  She was transferred to [facility] and underwent dual-chamber pacemaker implantation on 06/06/2021.   She was seen in the office in 09/2021 and was without symptoms of angina or decompensation.  Given prior noted intolerance of metoprolol with low normal BP, escalation of GDMT was deferred.  I last saw her in 03/2022, at which time she remained without symptoms of angina or cardiac decompensation.  She was walking on a regular basis, up to one third of a mile without cardiac limitation.  She did note some positional and remained without further syncope.  She was seen by EP in 05/2022 with device reprogrammed to minimize degree of V pacing.  She was without decompensation with recommendation to follow-up with them in 1 year.  She comes in accompanied by one of her daughters today and is doing well from a cardiac perspective.  No symptoms of angina or cardiac decompensation.  Dizziness is improved.  No presyncope, or syncope.  She does note some fatigue that she she initially noted following device reprogramming in 05/2022.  She continues to walk on a daily basis, at least one third of a mile without cardiac limitation.  No lower extremity swelling, abdominal distention, or orthopnea.  No falls or symptoms concerning for bleeding.  Weight remains stable.   Labs independently reviewed: 05/2022 - TC 234, TG 144, HDL 56, LDL 149, potassium 4.7, BUN 19, SCr 0.7, albumin 3.9, AST/ALT normal, TSH normal 11/2021 - Hgb 14.9, PLT 259, magnesium 2.4    Past Medical History:  Diagnosis Date   CAD (coronary artery disease)    Nonobstructive CAD   Complete heart block ([place]) 05/2021   s/p PPM   HFrEF (heart failure with reduced ejection fraction) ([place])    Hyperlipidemia LDL goal <70    Takotsubo cardiomyopathy    Thyroid disease     Past Surgical History:  Procedure Laterality Date   COLONOSCOPY  2013   cleared  for 10 yrs- Dr @ [place]     LEFT HEART CATH AND CORONARY ANGIOGRAPHY N/A 03/04/2021   Procedure: LEFT HEART CATH AND CORONARY ANGIOGRAPHY;  Surgeon: Nelva Bush, MD;  Location: [place] [facility];  Service: Cardiovascular;  Laterality: N/A;   PACEMAKER IMPLANT N/A 06/06/2021   Procedure: PACEMAKER IMPLANT;  Surgeon: Vickie Epley, MD;  Location: [place] [facility];  Service: Cardiovascular;  Laterality: N/A;    Current Medications: Current Meds  Medication Sig   aspirin EC (ASPIRIN LOW DOSE) 81 MG tablet TAKE 1 TABLET(81 MG) BY MOUTH DAILY. SWALLOW WHOLE   Calcium Citrate-Vitamin D (CALCIUM CITRATE +D PO) Take 1 tablet by mouth every morning.   Cholecalciferol (VITAMIN D3 PO) Take 1 tablet by mouth every morning.   Multiple Vitamin (MULTIVITAMIN WITH MINERALS) TABS tablet Take 1 tablet by mouth every morning.   SYNTHROID 137 MCG tablet Take 68.5 mcg by mouth at bedtime.    Allergies:   Patient has no known allergies.   Social History   Socioeconomic History   Marital status: Married    Spouse name: Not on file   Number of children: Not on file   Years of education: Not on file   Highest education  level: Not on file  Occupational History   Not on file  Tobacco Use   Smoking status: Never   Smokeless tobacco: Never  Vaping Use   Vaping Use: Never used  Substance and Sexual Activity   Alcohol use: No    Alcohol/week: 0.0 standard drinks of alcohol   Drug use: No   Sexual activity: Not Currently  Other Topics Concern   Not on file  Social History Narrative   Not on file   Social Determinants of Health   Financial Resource Strain: Not on file  Food Insecurity: Not on file  Transportation Needs: Not on file  Physical Activity: Not on file  Stress: Not on file  Social Connections: Not on file     Family History:  The patient's family history includes Heart disease in her father.  ROS:   12-point review of systems is negative unless  otherwise noted in HPI.   EKGs/Labs/Other Studies Reviewed:    Studies reviewed were summarized above. The additional studies were reviewed today:  LHC 03/04/2021: Conclusions: Moderate single-vessel coronary artery disease with 50 to 60% proximal LAD stenosis. Severely reduced LVEF with mid and apical hypokinesis/akinesis and hyperdynamic basilar contraction.  Findings are consistent with stress-induced cardiomyopathy. Moderately-severely elevated left ventricular filling pressure (LVEDP ~35 mmHg).   Recommendations: Admit to ICU for monitoring and medical optimization. Gentle diuresis. Add low-dose metoprolol as blood pressure tolerates. If blood pressure and renal function allow, consider addition of ACE inhibitor/ARB as soon as tomorrow. Aspirin and statin therapy to prevent progression of LAD disease. Obtain echocardiogram tomorrow with particular attention to the LV apex, as the patient is at risk for thrombus formation. __________   2D echo 03/05/2021: 1. Findings consistent with Takatsubo stress induced DCM. Akinesis of mid  and apical walls with hyperdyanamic basal function No mural apical  thrombus with or without definity . Left ventricular ejection fraction, by  estimation, is 25 to 30%. The left  ventricle has severely decreased function. The left ventricle has no  regional wall motion abnormalities. Left ventricular diastolic parameters  are consistent with Grade I diastolic dysfunction (impaired relaxation).   2. Right ventricular systolic function is normal. The right ventricular  size is normal.   3. The pericardial effusion is surrounding the apex and anterior to the  right ventricle.   4. The mitral valve is abnormal. Trivial mitral valve regurgitation. No  evidence of mitral stenosis.   5. The aortic valve is tricuspid. There is mild calcification of the  aortic valve. Aortic valve regurgitation is not visualized. Mild aortic  valve sclerosis is present, with no  evidence of aortic valve stenosis.   6. The inferior vena cava is normal in size with greater than 50%  respiratory variability, suggesting right atrial pressure of 3 mmHg. __________   Limited echo 04/20/2021: 1. Left ventricular ejection fraction, by estimation, is 55 to 60%. The  left ventricle has normal function. The left ventricle has no regional  wall motion abnormalities.   2. Right ventricular systolic function is normal. The right ventricular  size is normal.   3. The aortic valve was not well visualized. Aortic valve regurgitation  is not visualized.  __________   Elwyn Reach patch 04/2021: HR 21 - 115, average 85bpm. 1151 pauses, longest lasting 10 seconds. High degree AV block was present. Rare supraventricular and ventricular ectopy.   EKG:  EKG is ordered today.  The EKG ordered today demonstrates A sensed, V paced, 87 bpm, prolonged AV conduction  Recent  Labs: No results found for requested labs within last 365 days.  Recent Lipid Panel    Component Value Date/Time   CHOL 234 (H) 03/04/2021 1613   CHOL 227 (H) 03/10/2016 0827   TRIG 157 (H) 03/04/2021 1613   HDL 56 03/04/2021 1613   HDL 65 03/10/2016 0827   CHOLHDL 4.2 03/04/2021 1613   VLDL 31 03/04/2021 1613   LDLCALC 147 (H) 03/04/2021 1613   LDLCALC 144 (H) 03/10/2016 0827    PHYSICAL EXAM:    VS:  BP 128/60 (BP Location: Left Arm, Patient Position: Sitting, Cuff Size: Normal)   Pulse 87   Ht 5' 2"$  (1.575 m)   Wt 121 lb (54.9 kg)   SpO2 97%   BMI 22.13 kg/m   BMI: Body mass index is 22.13 kg/m.  Physical Exam Vitals reviewed.  Constitutional:      Appearance: She is well-developed.  HENT:     Head: Normocephalic and atraumatic.  Eyes:     General:        Right eye: No discharge.        Left eye: No discharge.  Neck:     Vascular: No JVD.  Cardiovascular:     Rate and Rhythm: Normal rate and regular rhythm.     Heart sounds: Normal heart sounds, S1 normal and S2 normal. Heart sounds not  distant. No midsystolic click and no opening snap. No murmur heard.    No friction rub.  Pulmonary:     Effort: Pulmonary effort is normal. No respiratory distress.     Breath sounds: Normal breath sounds. No decreased breath sounds, wheezing or rales.  Chest:     Chest wall: No tenderness.  Abdominal:     General: There is no distension.  Musculoskeletal:     Cervical back: Normal range of motion.     Right lower leg: No edema.     Left lower leg: No edema.  Skin:    General: Skin is warm and dry.     Nails: There is no clubbing.  Neurological:     Mental Status: She is alert and oriented to person, place, and time.  Psychiatric:        Speech: Speech normal.        Behavior: Behavior normal.        Thought Content: Thought content normal.        Judgment: Judgment normal.     Wt Readings from Last 3 Encounters:  10/10/22 121 lb (54.9 kg)  06/14/22 121 lb 9.6 oz (55.2 kg)  03/30/22 120 lb (54.4 kg)     ASSESSMENT & PLAN:   Stress-induced cardiomyopathy: She has had subsequent normalization of LV systolic function by echo in 03/2022.  She is euvolemic and well compensated with NYHA class I symptoms.  Relative hypotension limited the escalation and led to the ultimate discontinuation of GDMT.  Given normalization of LV systolic function, and in the context of relative hypotension, we will defer rechallenge of beta-blocker or ACE inhibitor/ARB at this time.  No further workup is indicated currently.  Complete heart block: Status post PPM.  Device functioning normally on most recent interrogation last month.  Follow-up with EP and device clinic as indicated.  Nonobstructive CAD/HLD: No symptoms suggestive of angina.  Previously on atorvastatin, though this was discontinued by neurology.  Most recent LDL of 149 in 05/2022 with goal LDL being less than 70.  Continues to work on a heart healthy diet, has recently initiated oatmeal.  Would prefer to defer initiation of medication if  possible.  We will plan to check a fasting lipid panel at her next visit.   Disposition: F/u with Dr. Saunders Revel or an APP in 6 months, and EP as directed.   Medication Adjustments/Labs and Tests Ordered: Current medicines are reviewed at length with the patient today.  Concerns regarding medicines are outlined above. Medication changes, Labs and Tests ordered today are summarized above and listed in the Patient Instructions accessible in Encounters.   Signed, Christell Faith, PA-C 10/10/2022 11:22 AM     [address], [place] [address] [phone]

## 2022-10-10 NOTE — Patient Instructions (Signed)
Medication Instructions:  Your physician recommends that you continue on your current medications as directed. Please refer to the Current Medication list given to you today.  *If you need a refill on your cardiac medications before your next appointment, please call your pharmacy*   Lab Work: No labs ordered  If you have labs (blood work) drawn today and your tests are completely normal, you will receive your results only by: [place] (if you have MyChart) OR A paper copy in the mail If you have any lab test that is abnormal or we need to change your treatment, we will call you to review the results.   Testing/Procedures: No testing ordered  Follow-Up: At [facility], you and your health needs are our priority.  As part of our continuing mission to provide you with exceptional heart care, we have created designated Provider Care Teams.  These Care Teams include your primary Cardiologist (physician) and Advanced Practice Providers (APPs -  Physician Assistants and Nurse Practitioners) who all work together to provide you with the care you need, when you need it.  We recommend signing up for the patient portal called "MyChart".  Sign up information is provided on this After Visit Summary.  MyChart is used to connect with patients for Virtual Visits (Telemedicine).  Patients are able to view lab/test results, encounter notes, upcoming appointments, etc.  Non-urgent messages can be sent to your provider as well.   To learn more about what you can do with MyChart, go to [email or web address].    Your next appointment:   6 month(s)  Provider:   You may see Nelva Bush, MD or one of the following Advanced Practice Providers on your designated Care Team:   Murray Hodgkins, NP Christell Faith, PA-C Cadence Kathlen Mody, PA-C Gerrie Nordmann, NP

## 2022-10-16 ENCOUNTER — Encounter: Payer: Self-pay | Admitting: Physician Assistant

## 2022-10-16 ENCOUNTER — Other Ambulatory Visit: Payer: Self-pay | Admitting: Physician Assistant

## 2022-10-16 NOTE — Telephone Encounter (Signed)
Error

## 2022-12-05 ENCOUNTER — Ambulatory Visit (INDEPENDENT_AMBULATORY_CARE_PROVIDER_SITE_OTHER): Payer: PPO

## 2022-12-05 DIAGNOSIS — I442 Atrioventricular block, complete: Secondary | ICD-10-CM

## 2022-12-06 LAB — CUP PACEART REMOTE DEVICE CHECK
Battery Voltage: 90
Date Time Interrogation Session: 20240410070257
Implantable Lead Connection Status: 753985
Implantable Lead Connection Status: 753985
Implantable Lead Implant Date: 20221010
Implantable Lead Implant Date: 20221010
Implantable Lead Location: 753859
Implantable Lead Location: 753860
Implantable Lead Model: 377171
Implantable Lead Model: 377171
Implantable Lead Serial Number: 8000510718
Implantable Lead Serial Number: 8000555079
Implantable Pulse Generator Implant Date: 20221010
Pulse Gen Model: 407145
Pulse Gen Serial Number: 70231665

## 2023-01-12 NOTE — Progress Notes (Signed)
Remote pacemaker transmission.   

## 2023-03-06 ENCOUNTER — Ambulatory Visit (INDEPENDENT_AMBULATORY_CARE_PROVIDER_SITE_OTHER): Payer: PPO

## 2023-03-06 DIAGNOSIS — I442 Atrioventricular block, complete: Secondary | ICD-10-CM

## 2023-03-06 LAB — CUP PACEART REMOTE DEVICE CHECK
Battery Voltage: 85
Date Time Interrogation Session: 20240709094450
Implantable Lead Connection Status: 753985
Implantable Lead Connection Status: 753985
Implantable Lead Implant Date: 20221010
Implantable Lead Implant Date: 20221010
Implantable Lead Location: 753859
Implantable Lead Location: 753860
Implantable Lead Model: 377171
Implantable Lead Model: 377171
Implantable Lead Serial Number: 8000510718
Implantable Lead Serial Number: 8000555079
Implantable Pulse Generator Implant Date: 20221010
Pulse Gen Model: 407145
Pulse Gen Serial Number: 70231665

## 2023-03-29 NOTE — Progress Notes (Signed)
Remote pacemaker transmission.   

## 2023-06-04 NOTE — Progress Notes (Unsigned)
Cardiology Office Note    Date:  06/05/2023   ID:  Korey, Arroyo [birth year], MRN [id number]  PCP:  Marina Goodell, MD  Cardiologist:  Yvonne Kendall, MD  Electrophysiologist:  Lanier Prude, MD   Chief Complaint: Follow up  History of Present Illness:   Lori Rosario is a 82 y.o. female with history of CAD, HFrEF secondary to stress-induced cardiomyopathy with normalization of LV systolic function by echo in 03/2021, syncope with complete heart block status post PPM on 06/06/2021, hyperthyroidism status post radioactive iodine therapy in her 30s now on replacement therapy, and HLD who presents for follow-up of her cardiomyopathy.   Prior to her admission in 02/2021 she did not have any known cardiac disease.  Initially, she presented to an urgent care with right otalgia.  Prior to leaving her appointment she developed substernal chest pain without radiation or associated symptoms.  EKG showed inferior lateral ST elevation.  In this setting she was transferred to [facility] ED.  She underwent emergent LHC which showed moderate single-vessel CAD with 50 to 60% proximal LAD stenosis, severely reduced LVEF with mid and apical hypokinesis/akinesis and hyperdynamic basal contraction with findings consistent with stress-induced cardiomyopathy, moderately to severely elevated LVEDP of approximately 35 mmHg.  Medical management and diuresis was recommended.  Echo on 03/05/2021 showed an EF of 25 to 30%, akinesis of mid and apical walls with hyperdynamic basal function with findings consistent with Takotsubo dilated cardiomyopathy, grade 1 diastolic dysfunction, normal RV systolic function and ventricular cavity size, a pericardial effusion surrounding the apex and anterior to the right ventricle was noted, trivial mitral regurgitation, mild aortic valve sclerosis without evidence of stenosis, and no evidence of mural apical thrombus, with or without Definity.  High-sensitivity troponin peaked at 4462.   She was discharged on low-dose Toprol-XL and as needed Lasix.  Relative hypotension precluded escalation of further GDMT.     She was seen in hospital follow-up on 04/06/2021 and was doing very well from a cardiac perspective.  She was ambulating one third of a mile per day without issues.  She was tolerating medications.  Repeat limited echo on 04/20/2021 showed normalization of LV systolic function with an EF of 55 to 60%, no regional wall motion abnormalities, normal RV systolic function and ventricular cavity size, and no significant valvular abnormalities.  Following her office visit in 03/2021, metoprolol was discontinued as there was concern this was contributing to her dizziness.   She was evaluated in the ED on 05/06/2021 with persistent dizziness.  Laboratory evaluation was unrevealing including high-sensitivity troponin.  MRI brain was nonacute.  EKG demonstrated sinus rhythm with known first-degree AV block.  Treated with a dose of meclizine with improvement in symptoms.  She was evaluated by ENT with symptoms felt to be related to vertigo.   She was seen in the office in 04/2021 and continued to note intermittent dizziness described as a room spinning sensation.  She reported meclizine had led to resolution of these episodes.  Home BP/HR log showed BP readings from 95-148/59-78 with heart rates ranging from 56 to 101 bpm with the majority of her BP readings in the 120s to 130s systolic and heart rates in the 80s to 90s bpm.  Given symptoms, she underwent outpatient cardiac monitoring, which demonstrated two episodes of complete heart block with heart rates ranging between 34 and 37 bpm with episodes persisting approximately 7 to 8 seconds.  There were 4 episodes of high-grade AV block.  Upon  our office contacting the patient, she had not had any further syncope.  She reported her neurologist had recommended she discontinue atorvastatin, and with this her dizziness had improved.  She was referred to EP.   However, prior to this appointment, she was admitted to the hospital in 05/2021 with syncope.  She was transferred to [facility] and underwent dual-chamber pacemaker implantation on 06/06/2021.  She was last seen in 09/2022 and was walking on a daily basis, at least one third of a mile without cardiac limitation.  Escalation of GDMT was deferred given normalization of LV systolic function, and in the context of relative hypotension having previously led to de-escalation of evidence-based medical therapy.  She preferred to work on heart healthy diet in place of reinitiating statin.  She comes in accompanied by one of her daughters today and is doing well from a cardiac perspective.  She is without symptoms of angina or cardiac decompensation.  No dizziness, presyncope, or syncope.  No falls or symptoms concerning for bleeding.  She remains active ambulating 1.5 to 2 miles on a regular basis without cardiac limitation.  No lower extremity swelling, abdominal distention, orthopnea.  No falls or symptoms concerning for bleeding.  Continues to eat a heart healthy diet.  Overall, feels well and does not have any acute cardiac concerns at this time.   Labs independently reviewed: 05/2022 - TC 234, TG 144, HDL 56, LDL 149, potassium 4.7, BUN 19, SCr 0.7, albumin 3.9, AST/ALT normal, TSH normal 11/2021 - Hgb 14.9, PLT 259, magnesium 2.4  Past Medical History:  Diagnosis Date   CAD (coronary artery disease)    Nonobstructive CAD   Complete heart block (HCC) 05/2021   s/p PPM   HFrEF (heart failure with reduced ejection fraction) (HCC)    Hyperlipidemia LDL goal <70    Takotsubo cardiomyopathy    Thyroid disease     Past Surgical History:  Procedure Laterality Date   COLONOSCOPY  2013   cleared for 10 yrs- Dr @ [facility]   INGUINAL HERNIA REPAIR     LEFT HEART CATH AND CORONARY ANGIOGRAPHY N/A 03/04/2021   Procedure: LEFT HEART CATH AND CORONARY ANGIOGRAPHY;  Surgeon: Yvonne Kendall, MD;  Location: [facility]  INVASIVE CV LAB;  Service: Cardiovascular;  Laterality: N/A;   PACEMAKER IMPLANT N/A 06/06/2021   Procedure: PACEMAKER IMPLANT;  Surgeon: Lanier Prude, MD;  Location: [facility] INVASIVE CV LAB;  Service: Cardiovascular;  Laterality: N/A;    Current Medications: Current Meds  Medication Sig   aspirin EC (ASPIRIN LOW DOSE) 81 MG tablet TAKE 1 TABLET(81 MG) BY MOUTH DAILY. SWALLOW WHOLE   Calcium Citrate-Vitamin D (CALCIUM CITRATE +D PO) Take 1 tablet by mouth every morning.   Cholecalciferol (VITAMIN D3 PO) Take 1 tablet by mouth every morning.   Multiple Vitamin (MULTIVITAMIN WITH MINERALS) TABS tablet Take 1 tablet by mouth every morning.   SYNTHROID 137 MCG tablet Take 68.5 mcg by mouth at bedtime.    Allergies:   Atorvastatin   Social History   Socioeconomic History   Marital status: Married    Spouse name: Not on file   Number of children: Not on file   Years of education: Not on file   Highest education level: Not on file  Occupational History   Not on file  Tobacco Use   Smoking status: Never   Smokeless tobacco: Never  Vaping Use   Vaping status: Never Used  Substance and Sexual Activity   Alcohol use: No  Alcohol/week: 0.0 standard drinks of alcohol   Drug use: No   Sexual activity: Not Currently  Other Topics Concern   Not on file  Social History Narrative   Not on file   Social Determinants of Health   Financial Resource Strain: Not on file  Food Insecurity: Not on file  Transportation Needs: Not on file  Physical Activity: Not on file  Stress: Not on file  Social Connections: Not on file     Family History:  The patient's family history includes Heart disease in her father.  ROS:   12-point review of systems is negative unless otherwise noted in the HPI.   EKGs/Labs/Other Studies Reviewed:    Studies reviewed were summarized above. The additional studies were reviewed today:  LHC 03/04/2021: Conclusions: Moderate single-vessel coronary artery  disease with 50 to 60% proximal LAD stenosis. Severely reduced LVEF with mid and apical hypokinesis/akinesis and hyperdynamic basilar contraction.  Findings are consistent with stress-induced cardiomyopathy. Moderately-severely elevated left ventricular filling pressure (LVEDP ~35 mmHg).   Recommendations: Admit to ICU for monitoring and medical optimization. Gentle diuresis. Add low-dose metoprolol as blood pressure tolerates. If blood pressure and renal function allow, consider addition of ACE inhibitor/ARB as soon as tomorrow. Aspirin and statin therapy to prevent progression of LAD disease. Obtain echocardiogram tomorrow with particular attention to the LV apex, as the patient is at risk for thrombus formation. __________   2D echo 03/05/2021: 1. Findings consistent with Takatsubo stress induced DCM. Akinesis of mid  and apical walls with hyperdyanamic basal function No mural apical  thrombus with or without definity . Left ventricular ejection fraction, by  estimation, is 25 to 30%. The left  ventricle has severely decreased function. The left ventricle has no  regional wall motion abnormalities. Left ventricular diastolic parameters  are consistent with Grade I diastolic dysfunction (impaired relaxation).   2. Right ventricular systolic function is normal. The right ventricular  size is normal.   3. The pericardial effusion is surrounding the apex and anterior to the  right ventricle.   4. The mitral valve is abnormal. Trivial mitral valve regurgitation. No  evidence of mitral stenosis.   5. The aortic valve is tricuspid. There is mild calcification of the  aortic valve. Aortic valve regurgitation is not visualized. Mild aortic  valve sclerosis is present, with no evidence of aortic valve stenosis.   6. The inferior vena cava is normal in size with greater than 50%  respiratory variability, suggesting right atrial pressure of 3 mmHg. __________   Limited echo 04/20/2021: 1. Left  ventricular ejection fraction, by estimation, is 55 to 60%. The  left ventricle has normal function. The left ventricle has no regional  wall motion abnormalities.   2. Right ventricular systolic function is normal. The right ventricular  size is normal.   3. The aortic valve was not well visualized. Aortic valve regurgitation  is not visualized.  __________   Luc[facility] patch 04/2021: HR 21 - 115, average 85bpm. 1151 pauses, longest lasting 10 seconds. High degree AV block was present. Rare supraventricular and ventricular ectopy.   EKG:  EKG is ordered today.  The EKG ordered today demonstrates A-sensed, V-paced, prolonged AV conduction, 85 bpm  Recent Labs: No results found for requested labs within last 365 days.  Recent Lipid Panel    Component Value Date/Time   CHOL 234 (H) 03/04/2021 1613   CHOL 227 (H) 03/10/2016 0827   TRIG 157 (H) 03/04/2021 1613   HDL 56 03/04/2021 1613  HDL 65 03/10/2016 0827   CHOLHDL 4.2 03/04/2021 1613   VLDL 31 03/04/2021 1613   LDLCALC 147 (H) 03/04/2021 1613   LDLCALC 144 (H) 03/10/2016 0827    PHYSICAL EXAM:    VS:  BP (!) 110/50 (BP Location: Left Arm, Patient Position: Sitting)   Pulse 80   Ht 5\' 2"  (1.575 m)   Wt 117 lb 6.4 oz (53.3 kg)   SpO2 96%   BMI 21.47 kg/m   BMI: Body mass index is 21.47 kg/m.  Physical Exam Vitals reviewed.  Constitutional:      Appearance: She is well-developed.  HENT:     Head: Normocephalic and atraumatic.  Eyes:     General:        Right eye: No discharge.        Left eye: No discharge.  Cardiovascular:     Rate and Rhythm: Normal rate and regular rhythm.     Pulses:          Posterior tibial pulses are 2+ on the right side and 2+ on the left side.     Heart sounds: Normal heart sounds, S1 normal and S2 normal. Heart sounds not distant. No midsystolic click and no opening snap. No murmur heard.    No friction rub.  Pulmonary:     Effort: Pulmonary effort is normal. No respiratory distress.      Breath sounds: Normal breath sounds. No decreased breath sounds, wheezing, rhonchi or rales.  Chest:     Chest wall: No tenderness.  Abdominal:     General: There is no distension.  Musculoskeletal:     Cervical back: Normal range of motion.     Right lower leg: No edema.     Left lower leg: No edema.  Skin:    General: Skin is warm and dry.     Nails: There is no clubbing.  Neurological:     Mental Status: She is alert and oriented to person, place, and time.  Psychiatric:        Speech: Speech normal.        Behavior: Behavior normal.        Thought Content: Thought content normal.        Judgment: Judgment normal.     Wt Readings from Last 3 Encounters:  06/05/23 117 lb 6.4 oz (53.3 kg)  10/10/22 121 lb (54.9 kg)  06/14/22 121 lb 9.6 oz (55.2 kg)     ASSESSMENT & PLAN:   Stress-induced cardiomyopathy: She has had subsequent normalization of LV systolic function by echo in 03/2022.  She is euvolemic and well compensated with NYHA class I symptoms.  Relative hypotension has limited de-escalation and led to the ultimate discontinuation of GDMT.  Given the lack of heart failure symptoms, and in the context of normalization of LV systolic function, defer rechallenge of beta-blocker or ACE inhibitor/ARB at this time.  No further workup is indicated currently.  Complete heart block: Status post PPM.  Device functioning normally on most recent interrogation last month.  Follow-up with EP and device clinic as indicated.   Nonobstructive CAD/HLD with statin intolerance: No symptoms suggestive of angina. LDL 149.  Previously on atorvastatin, but this was discontinued by neurology.  She also noted nausea with atorvastatin.  Target LDL less than 70.  She prefers to minimize medications were possible.  Following cardiology diet.  Check lipid panel and LFT.  If LDL remains above goal she is agreeable to undergoing trial of rosuvastatin 5 mg.  Disposition: F/u with Dr. Okey Dupre or an APP in 6  months, and EP as directed.    Medication Adjustments/Labs and Tests Ordered: Current medicines are reviewed at length with the patient today.  Concerns regarding medicines are outlined above. Medication changes, Labs and Tests ordered today are summarized above and listed in the Patient Instructions accessible in Encounters.   Signed, Eula Listen, PA-C 06/05/2023 12:51 PM     [facility] - [place] [address] [place], [place] [address] [phone]

## 2023-06-05 ENCOUNTER — Encounter: Payer: Self-pay | Admitting: Physician Assistant

## 2023-06-05 ENCOUNTER — Ambulatory Visit: Payer: PPO

## 2023-06-05 ENCOUNTER — Ambulatory Visit: Payer: PPO | Attending: Physician Assistant | Admitting: Physician Assistant

## 2023-06-05 VITALS — BP 110/50 | HR 80 | Ht 62.0 in | Wt 117.4 lb

## 2023-06-05 DIAGNOSIS — Z950 Presence of cardiac pacemaker: Secondary | ICD-10-CM | POA: Diagnosis not present

## 2023-06-05 DIAGNOSIS — I442 Atrioventricular block, complete: Secondary | ICD-10-CM

## 2023-06-05 DIAGNOSIS — I5181 Takotsubo syndrome: Secondary | ICD-10-CM | POA: Diagnosis not present

## 2023-06-05 DIAGNOSIS — Z789 Other specified health status: Secondary | ICD-10-CM | POA: Diagnosis not present

## 2023-06-05 DIAGNOSIS — Z79899 Other long term (current) drug therapy: Secondary | ICD-10-CM | POA: Diagnosis not present

## 2023-06-05 DIAGNOSIS — I2510 Atherosclerotic heart disease of native coronary artery without angina pectoris: Secondary | ICD-10-CM

## 2023-06-05 DIAGNOSIS — E785 Hyperlipidemia, unspecified: Secondary | ICD-10-CM

## 2023-06-05 LAB — CUP PACEART REMOTE DEVICE CHECK
Date Time Interrogation Session: 20241008080212
Implantable Lead Connection Status: 753985
Implantable Lead Connection Status: 753985
Implantable Lead Implant Date: 20221010
Implantable Lead Implant Date: 20221010
Implantable Lead Location: 753859
Implantable Lead Location: 753860
Implantable Lead Model: 377171
Implantable Lead Model: 377171
Implantable Lead Serial Number: 8000510718
Implantable Lead Serial Number: 8000555079
Implantable Pulse Generator Implant Date: 20221010
Pulse Gen Model: 407145
Pulse Gen Serial Number: 70231665

## 2023-06-05 NOTE — Patient Instructions (Signed)
Medication Instructions:  Your Physician recommend you continue on your current medication as directed.    *If you need a refill on your cardiac medications before your next appointment, please call your pharmacy*   Lab Work: Your provider would like for you to have following labs drawn today LFT and Lipids.   If you have labs (blood work) drawn today and your tests are completely normal, you will receive your results only by: MyChart Message (if you have MyChart) OR A paper copy in the mail If you have any lab test that is abnormal or we need to change your treatment, we will call you to review the results.   Follow-Up: At [facility], you and your health needs are our priority.  As part of our continuing mission to provide you with exceptional heart care, we have created designated Provider Care Teams.  These Care Teams include your primary Cardiologist (physician) and Advanced Practice Providers (APPs -  Physician Assistants and Nurse Practitioners) who all work together to provide you with the care you need, when you need it.  We recommend signing up for the patient portal called "MyChart".  Sign up information is provided on this After Visit Summary.  MyChart is used to connect with patients for Virtual Visits (Telemedicine).  Patients are able to view lab/test results, encounter notes, upcoming appointments, etc.  Non-urgent messages can be sent to your provider as well.   To learn more about what you can do with MyChart, go to [email or web address].    Your next appointment:   6 month(s)  Provider:   You may see Yvonne Kendall, MD or one of the following Advanced Practice Providers on your designated Care Team:   Nicolasa Ducking, NP Eula Listen, PA-C Cadence Fransico Michael, PA-C Charlsie Quest, NP

## 2023-06-06 LAB — LIPID PANEL
Chol/HDL Ratio: 4.1 {ratio} (ref 0.0–4.4)
Cholesterol, Total: 226 mg/dL — ABNORMAL HIGH (ref 100–199)
HDL: 55 mg/dL (ref >39)
LDL Chol Calc (NIH): 141 mg/dL — ABNORMAL HIGH (ref 0–99)
Triglycerides: 166 mg/dL — ABNORMAL HIGH (ref 0–149)
VLDL Cholesterol Cal: 30 mg/dL (ref 5–40)

## 2023-06-06 LAB — HEPATIC FUNCTION PANEL
ALT: 16 [IU]/L (ref 0–32)
AST: 20 [IU]/L (ref 0–40)
Albumin: 4.1 g/dL (ref 3.7–4.7)
Alkaline Phosphatase: 82 [IU]/L (ref 44–121)
Bilirubin Total: 0.3 mg/dL (ref 0.0–1.2)
Bilirubin, Direct: <0.1 mg/dL (ref 0.00–0.40)
Total Protein: 6.7 g/dL (ref 6.0–8.5)

## 2023-06-07 ENCOUNTER — Other Ambulatory Visit: Payer: Self-pay | Admitting: Emergency Medicine

## 2023-06-07 DIAGNOSIS — E785 Hyperlipidemia, unspecified: Secondary | ICD-10-CM

## 2023-06-07 MED ORDER — ROSUVASTATIN CALCIUM 5 MG PO TABS
5.0000 mg | ORAL_TABLET | Freq: Every day | ORAL | 3 refills | Status: DC
Start: 2023-06-07 — End: 2024-05-19

## 2023-06-08 IMAGING — CT CT CERVICAL SPINE W/O CM
3 of 4 series · 12 of 33 positions shown, 14 images · non-contrast
Comparison: None

CLINICAL DATA: Syncopal episode today, neck trauma

EXAM:
CT CERVICAL SPINE WITHOUT CONTRAST
TECHNIQUE: Multidetector CT imaging of the cervical spine was performed without
intravenous contrast. Multiplanar CT image reconstructions were also
generated.

[Series 4: sagittal bone · sagittal · 0.25mm/px · 5 of 78 slices shown, 6 images]
[im 26/78  bone]
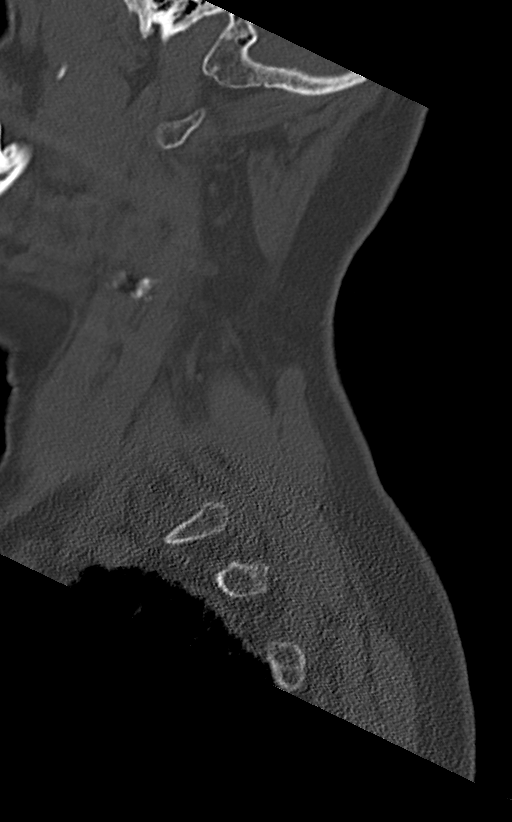
[im 33/78  bone]
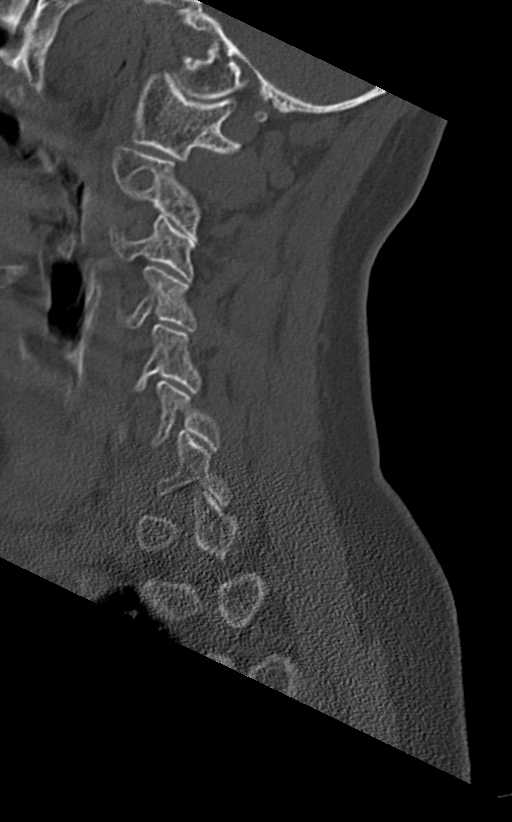
[im 39/78  soft-tissue]
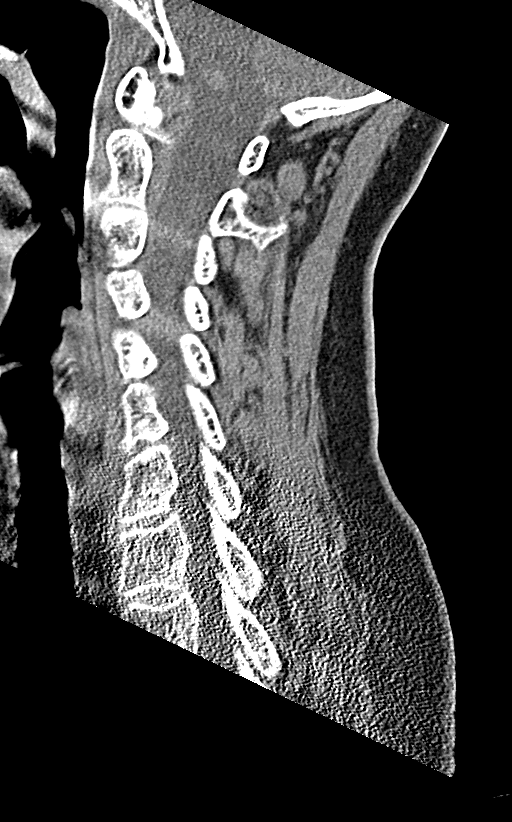
[im 39/78  bone]
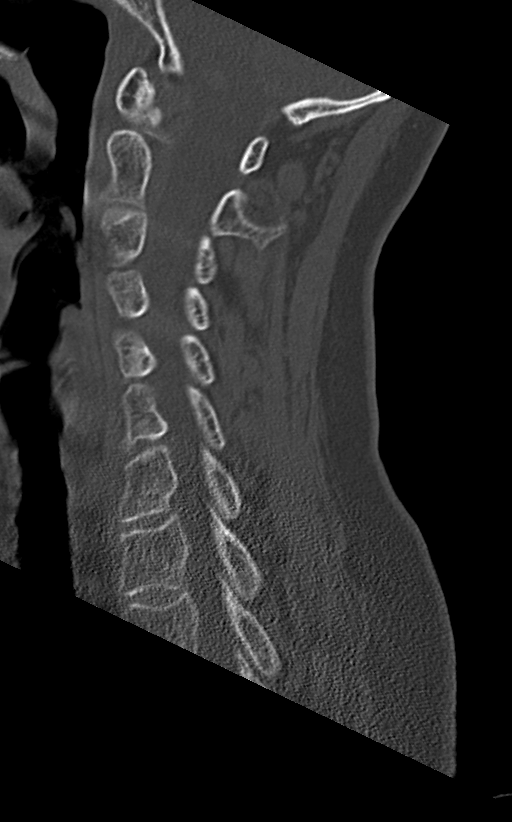
[im 45/78  bone]
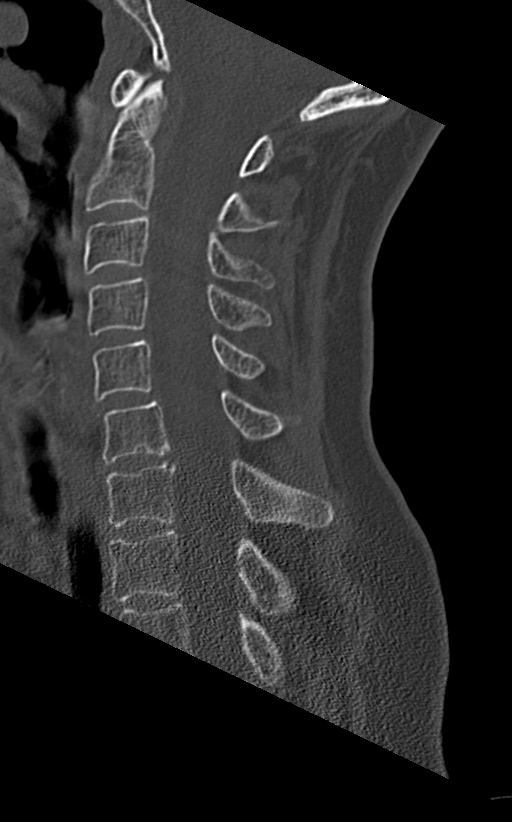
[im 52/78  bone]
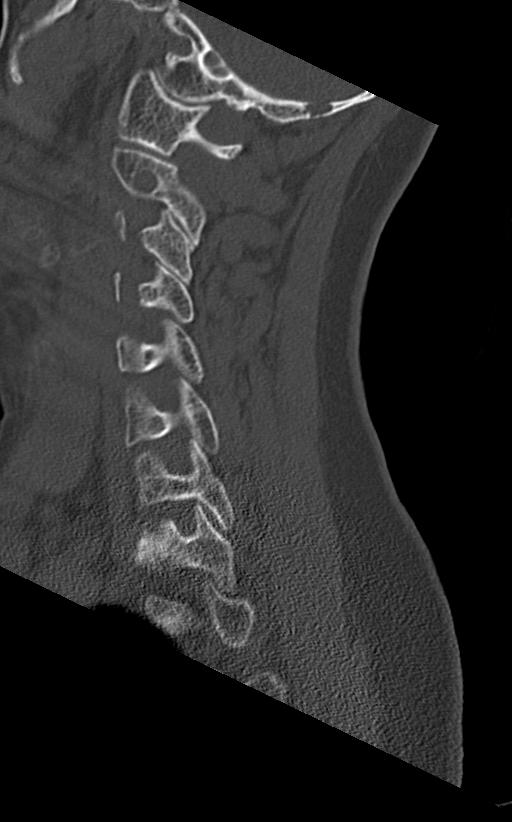

[Series 5: coronal bone · coronal · 0.30mm/px · 3 of 65 slices shown]
[im 16/65  bone]
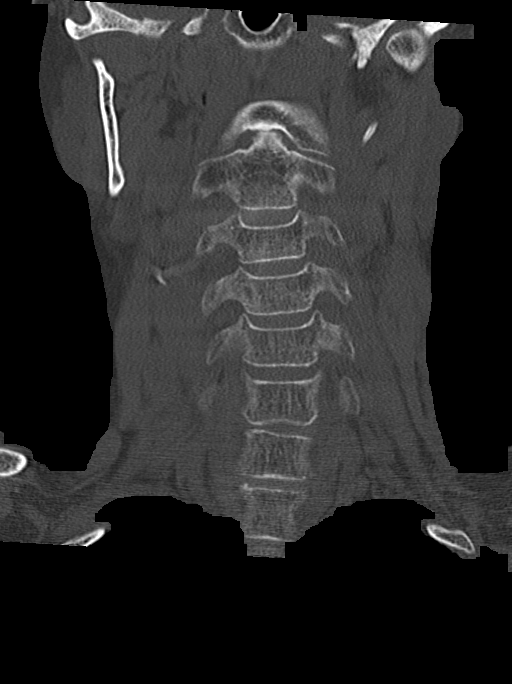
[im 27/65  bone]
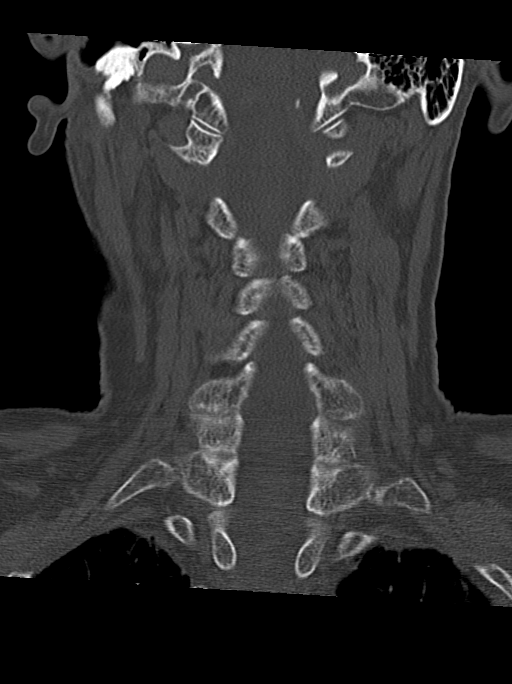
[im 38/65  bone]
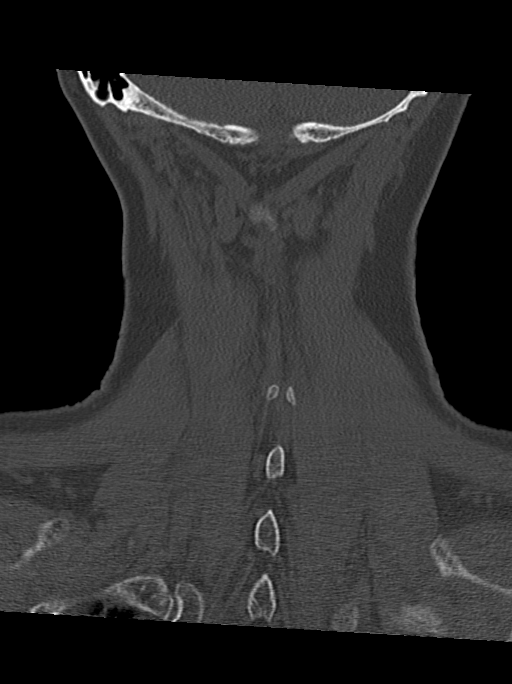

[Series 6: orthogonal bone · axial · 0.25mm/px · z∈[-260,-127]mm · 4 of 104 slices shown, 5 images]
[im 15/104  soft-tissue]
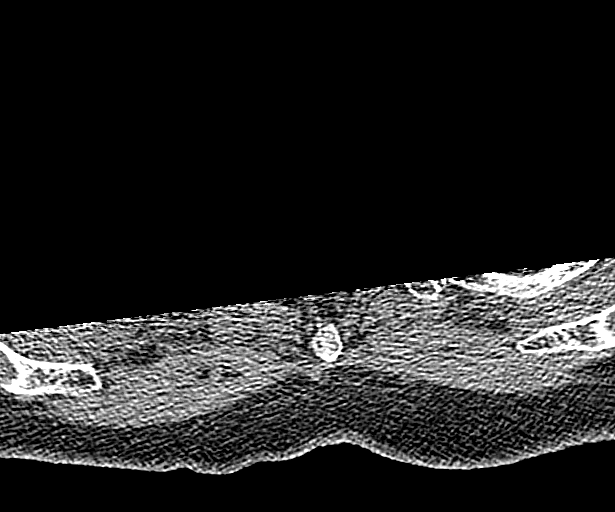
[im 15/104  bone]
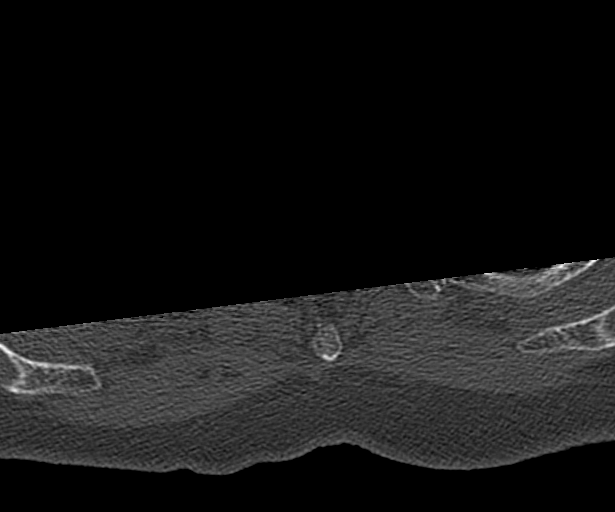
[im 45/104  bone]
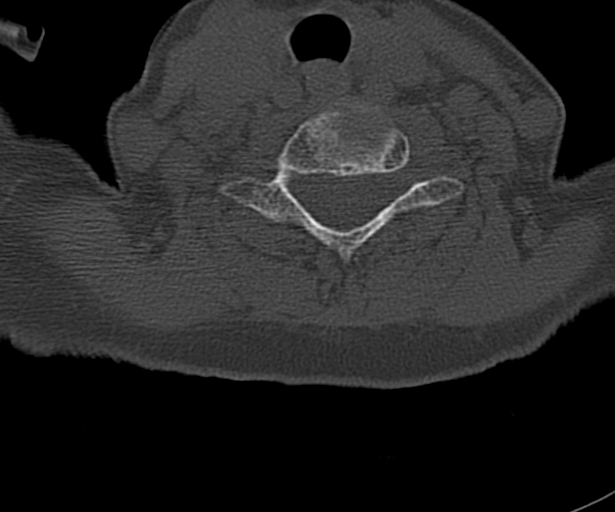
[im 59/104  bone]
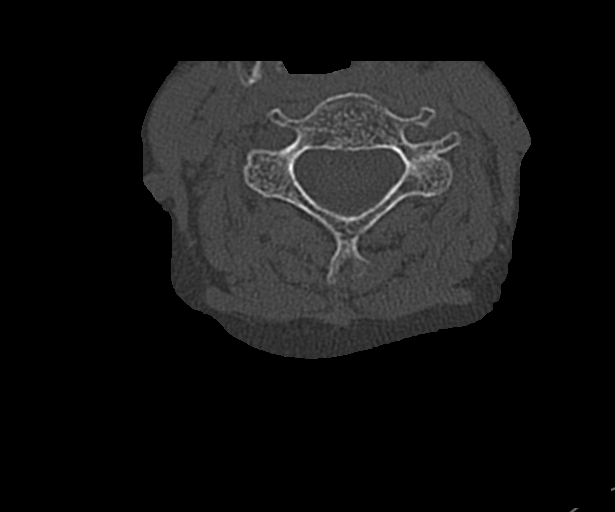
[im 89/104  bone]
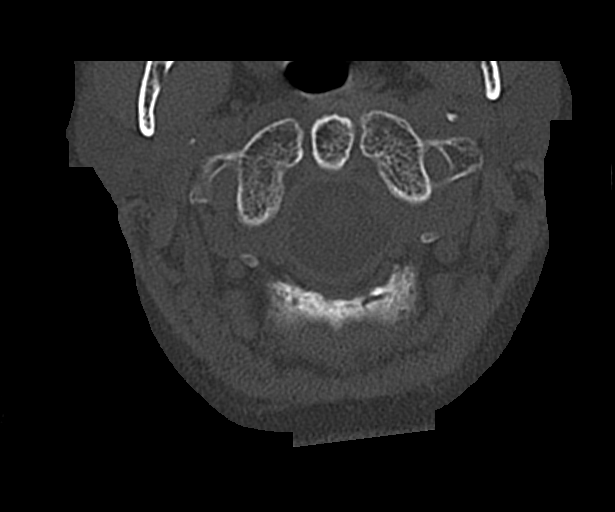

[12 of 33 positions shown; findings below may reference images not displayed]

FINDINGS: Alignment: Normal

Skull base and vertebrae: Osseous demineralization. Vertebral body
heights maintained. Facet alignments normal. Minimal disc space
narrowing and C6-C7. Remaining disc space heights maintained. Skull
base intact. No fracture, subluxation, or bone destruction.

Soft tissues and spinal canal: Prevertebral soft tissues normal
thickness. Scattered atherosclerotic calcifications.

Disc levels:  No specific abnormalities

Upper chest: Minimal biapical scarring, lung apices otherwise clear

Other: N/A
IMPRESSION: Osseous demineralization with minimal degenerative disc disease
changes at C6-C7.

No acute cervical spine abnormalities.

## 2023-06-08 IMAGING — DX DG CHEST 1V PORT
1 series · 1 of 1 positions shown · non-contrast
Comparison: None.

CLINICAL DATA: Syncope today.

EXAM:
PORTABLE CHEST 1 VIEW

[chest ap]
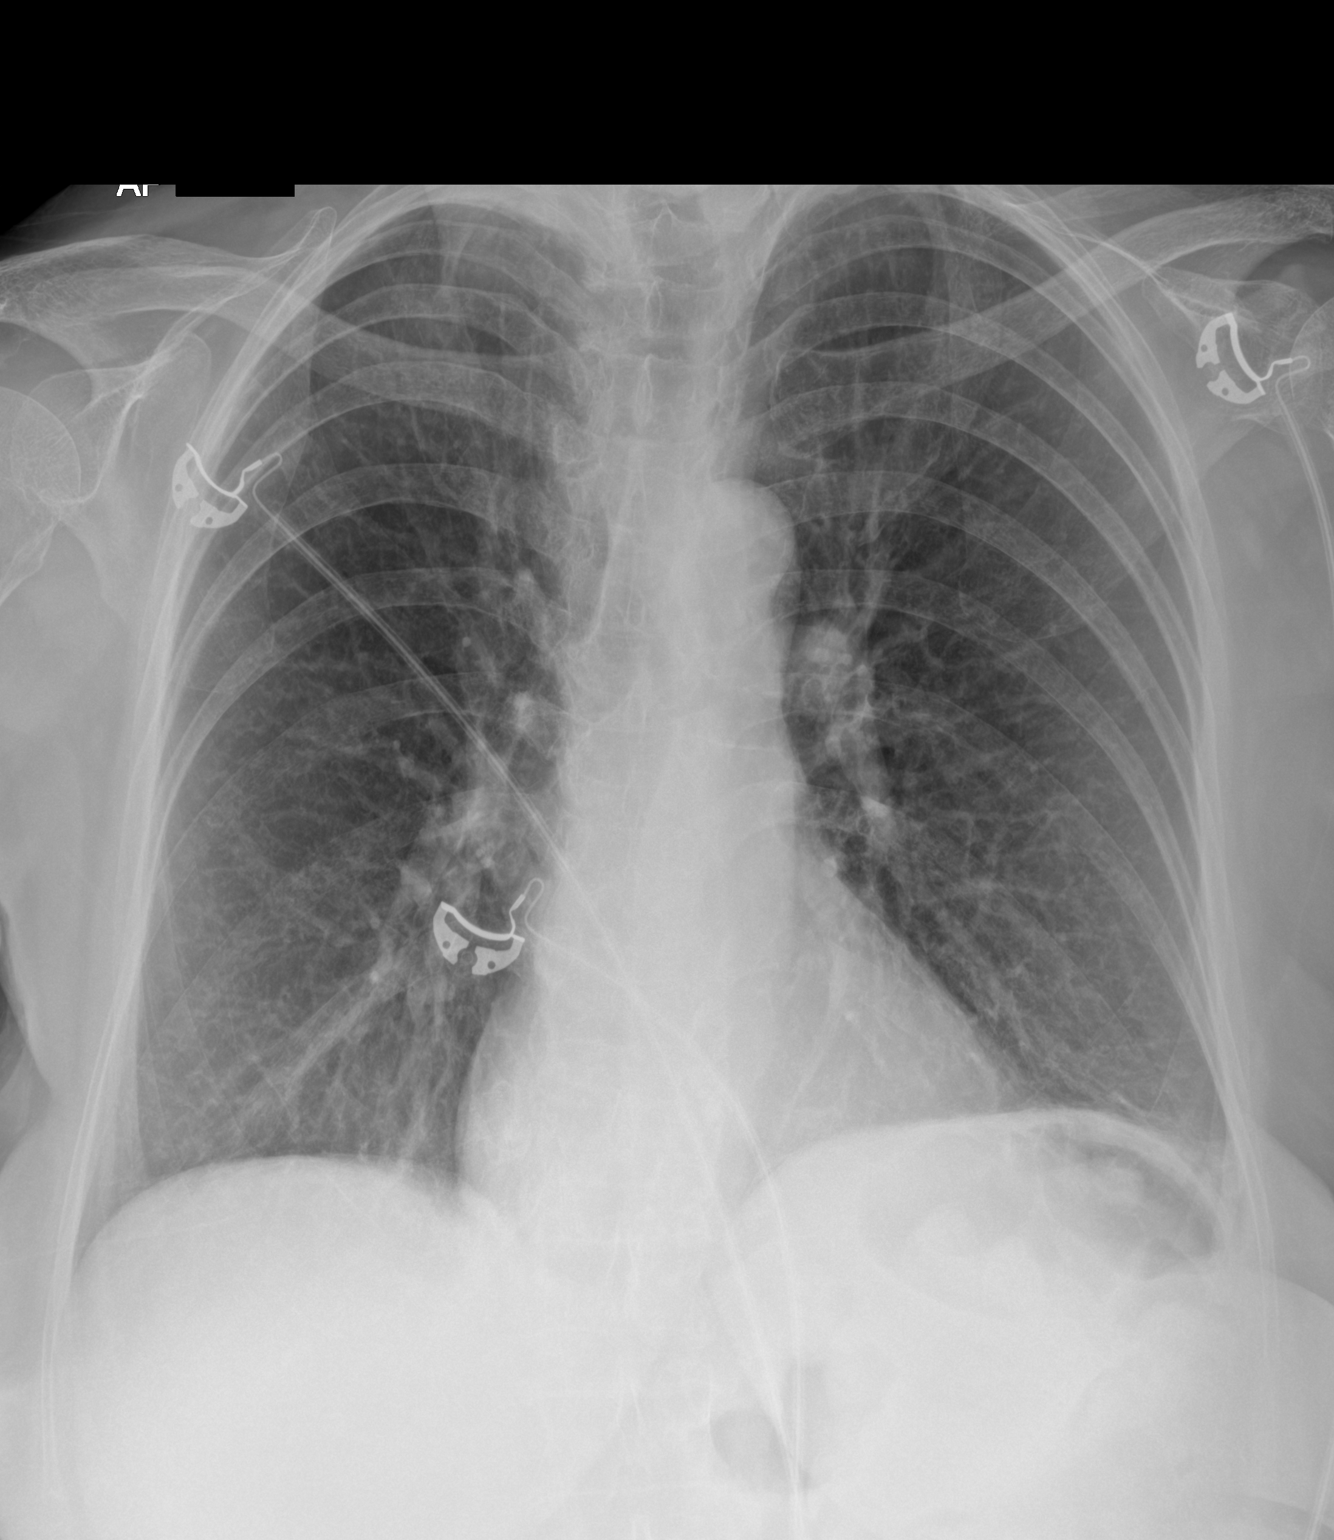

[1 of 1 positions shown; findings below may reference images not displayed]

FINDINGS: The heart size and mediastinal contours are within normal limits.
Both lungs are clear. The visualized skeletal structures are
unremarkable.
IMPRESSION: No active disease.

## 2023-06-10 IMAGING — DX DG CHEST 2V
2 series · 2 of 2 positions shown · non-contrast
Comparison: Chest radiograph 06/05/2021

CLINICAL DATA: Sore arm after pacemaker placement

EXAM:
CHEST - 2 VIEW

[w chest pa]
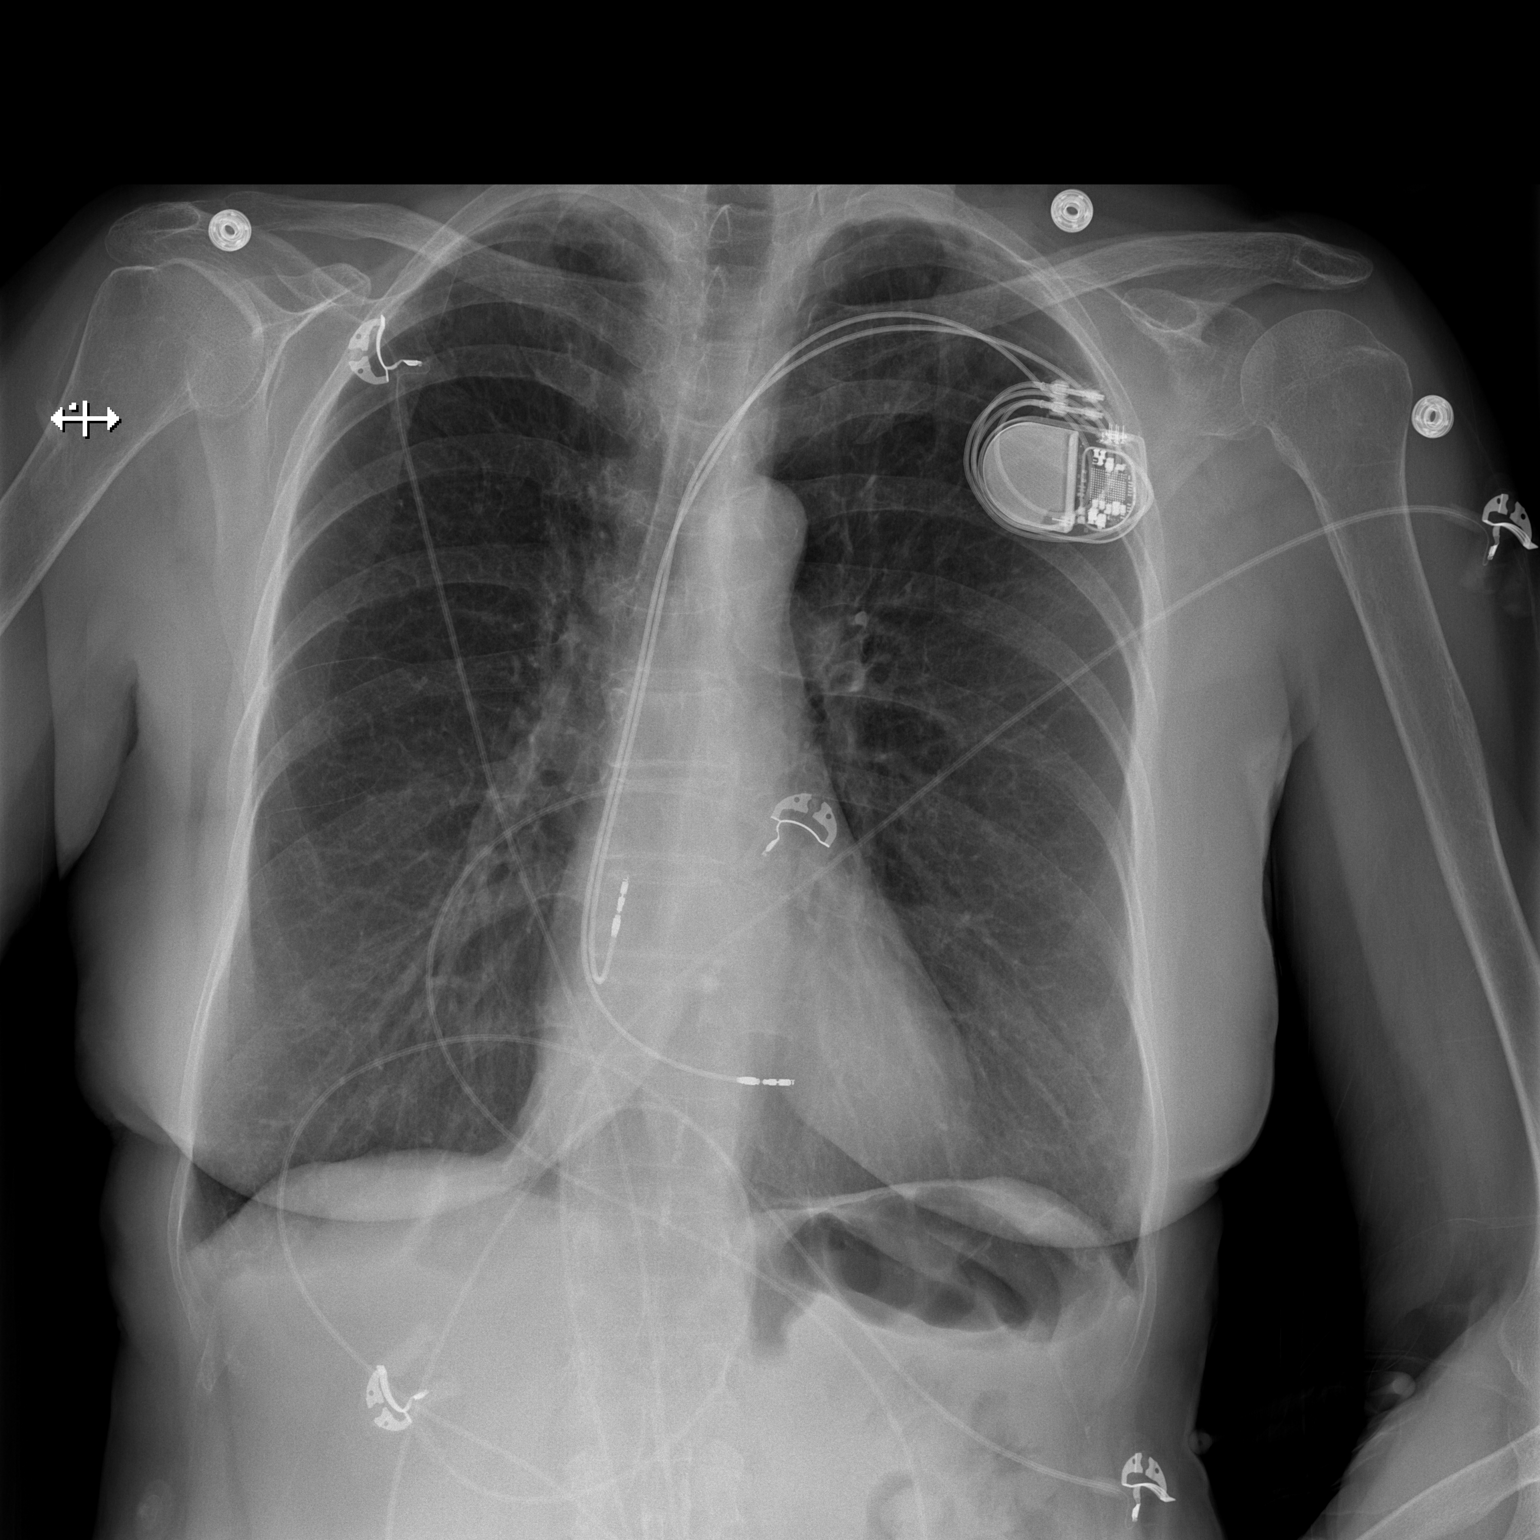

[w chest lat]
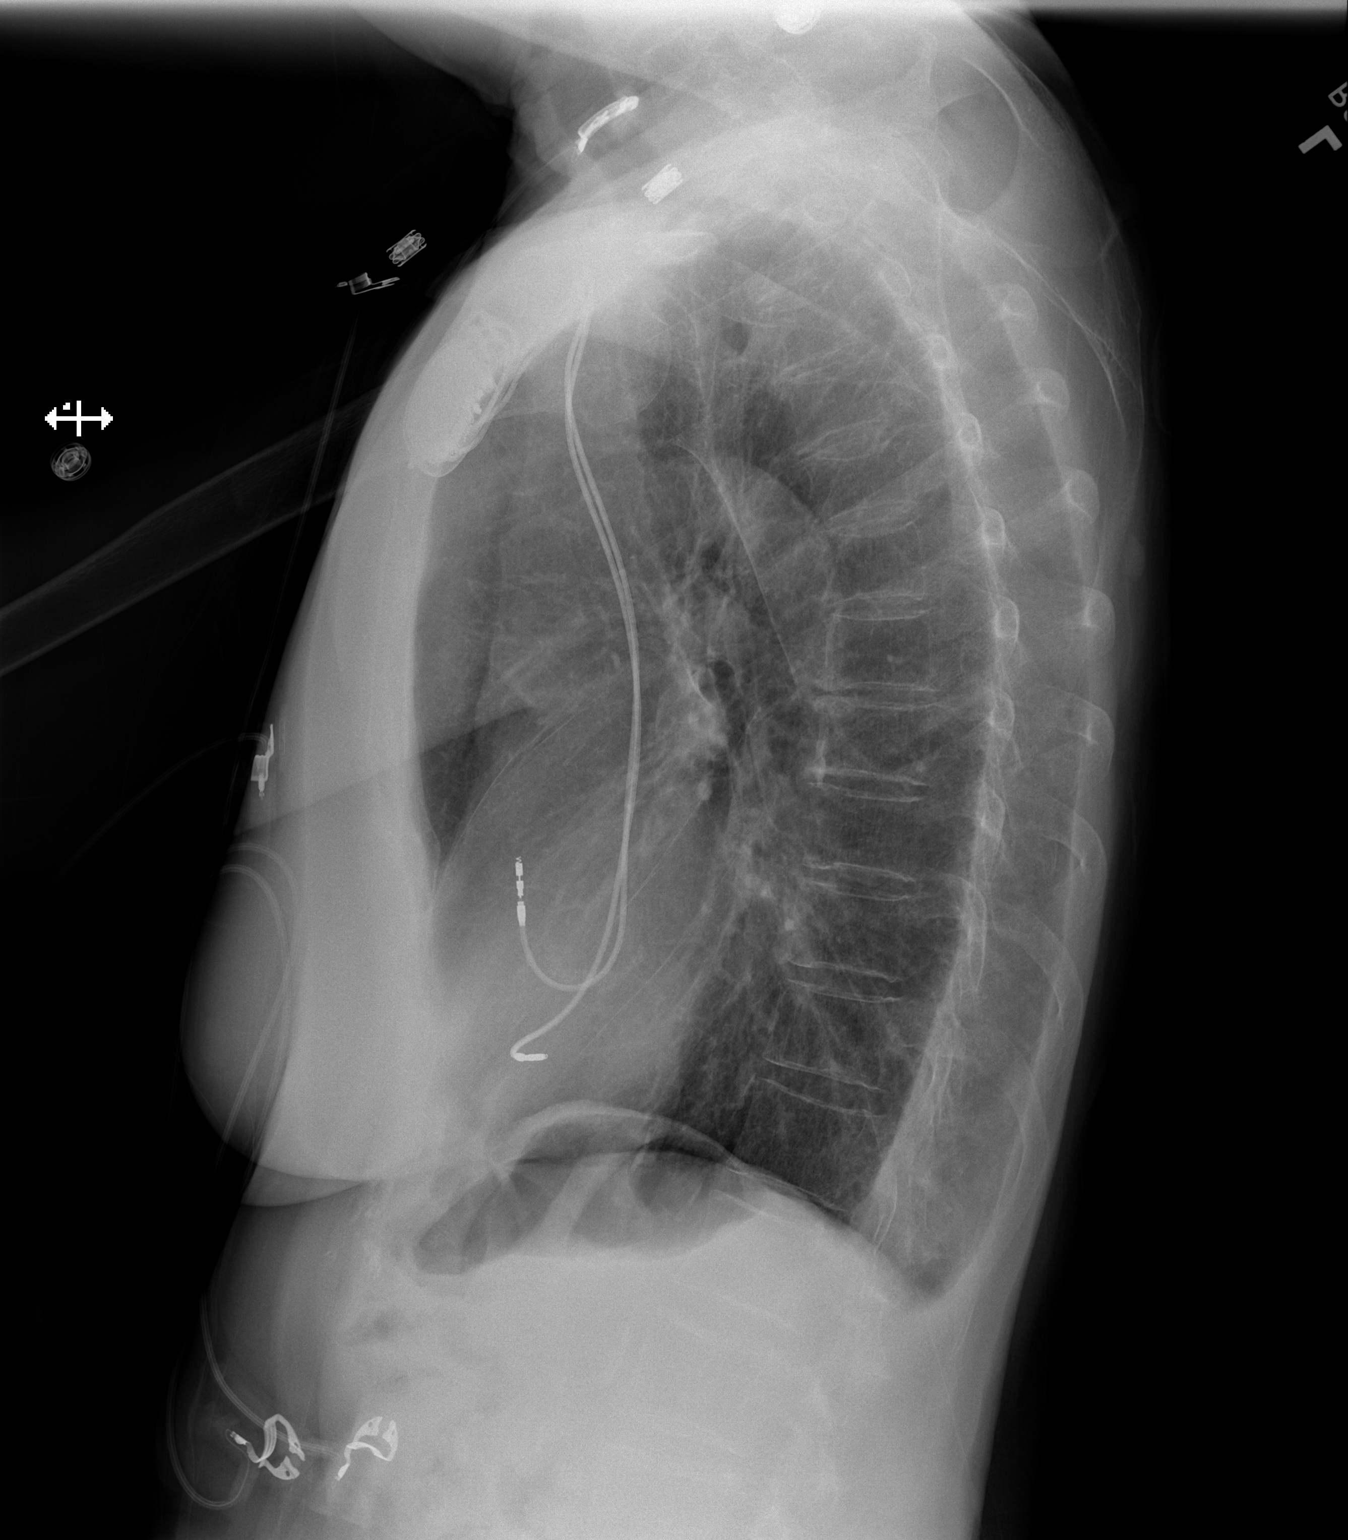

[2 of 2 positions shown; findings below may reference images not displayed]

FINDINGS: There is a new left chest wall pacer with leads terminating over the
expected locations of the right atrium and right ventricle. There is
no evidence of complication.

The cardiomediastinal silhouette is stable.

There is no focal consolidation or pulmonary edema. There is no
pleural effusion or pneumothorax.

There is no acute osseous abnormality.
IMPRESSION: New left chest wall cardiac device without evidence of complication.

## 2023-06-12 DIAGNOSIS — Z0000 Encounter for general adult medical examination without abnormal findings: Secondary | ICD-10-CM | POA: Diagnosis not present

## 2023-06-19 DIAGNOSIS — I442 Atrioventricular block, complete: Secondary | ICD-10-CM | POA: Diagnosis not present

## 2023-06-19 DIAGNOSIS — E7800 Pure hypercholesterolemia, unspecified: Secondary | ICD-10-CM | POA: Diagnosis not present

## 2023-06-19 DIAGNOSIS — E890 Postprocedural hypothyroidism: Secondary | ICD-10-CM | POA: Diagnosis not present

## 2023-06-19 DIAGNOSIS — Z23 Encounter for immunization: Secondary | ICD-10-CM | POA: Diagnosis not present

## 2023-06-19 DIAGNOSIS — I5032 Chronic diastolic (congestive) heart failure: Secondary | ICD-10-CM | POA: Diagnosis not present

## 2023-06-19 DIAGNOSIS — Z0000 Encounter for general adult medical examination without abnormal findings: Secondary | ICD-10-CM | POA: Diagnosis not present

## 2023-06-21 NOTE — Progress Notes (Signed)
Remote pacemaker transmission.   

## 2023-07-04 NOTE — Progress Notes (Unsigned)
Cardiology Office Note Date:  07/05/2023  Patient ID:  Lori, Rosario 1940/12/02, MRN [id number] PCP:  Marina Goodell, MD  Cardiologist:  Yvonne Kendall, MD Electrophysiologist: Lanier Prude, MD     Chief Complaint: 1 year device follow-up  History of Present Illness: Lori Rosario is a 82 y.o. female with PMH notable for CHB s/p PPM, CAD, HFimpEF; seen today for Lanier Prude, MD for routine electrophysiology followup.  She last saw Dr. Lalla Brothers 05/2022 and was doing well at that time.   On follow-up today, she has had no further episodes of syncope. She does have intermittent episodes of dizziness, usually with position changes like sitting > standing, but has happened a few times with walking. The dizziness resolves quickly with time or sitting.   she denies chest pain, palpitations, dyspnea, PND, orthopnea, nausea, vomiting, edema, weight gain, or early satiety.     Device Information: Biotronik dual chamber PPM, imp 05/2021; dx CHB   Past Medical History:  Diagnosis Date   CAD (coronary artery disease)    Nonobstructive CAD   Complete heart block (HCC) 05/2021   s/p PPM   HFrEF (heart failure with reduced ejection fraction) (HCC)    Hyperlipidemia LDL goal <70    Takotsubo cardiomyopathy    Thyroid disease     Past Surgical History:  Procedure Laterality Date   COLONOSCOPY  2013   cleared for 10 yrs- Dr @ [facility]   INGUINAL HERNIA REPAIR     LEFT HEART CATH AND CORONARY ANGIOGRAPHY N/A 03/04/2021   Procedure: LEFT HEART CATH AND CORONARY ANGIOGRAPHY;  Surgeon: Yvonne Kendall, MD;  Location: [facility] INVASIVE CV LAB;  Service: Cardiovascular;  Laterality: N/A;   PACEMAKER IMPLANT N/A 06/06/2021   Procedure: PACEMAKER IMPLANT;  Surgeon: Lanier Prude, MD;  [facility]: [facility];  Service: Cardiovascular;  Laterality: N/A;    Current Outpatient Medications  Medication Instructions   aspirin EC (ASPIRIN LOW DOSE) 81 MG tablet TAKE 1 TABLET(81  MG) BY MOUTH DAILY. SWALLOW WHOLE   Calcium Citrate-Vitamin D (CALCIUM CITRATE +D PO) 1 tablet, Oral, Every morning   Cholecalciferol (VITAMIN D3 PO) 1 tablet, Oral, Every morning   Multiple Vitamin (MULTIVITAMIN WITH MINERALS) TABS tablet 1 tablet, Oral, Every morning   rosuvastatin (CRESTOR) 5 mg, Oral, Daily   Synthroid 68.5 mcg, Oral, Daily at bedtime    Social History:  The patient  reports that she has never smoked. She has never used smokeless tobacco. She reports that she does not drink alcohol and does not use drugs.   Family History:  The patient's family history includes Heart disease in her father.  ROS:  Please see the history of present illness. All other systems are reviewed and otherwise negative.   PHYSICAL EXAM:  VS:  BP (!) 120/50 (BP Location: Left Arm, Patient Position: Sitting, Cuff Size: Normal)   Pulse 82   Ht 5\' 2"  (1.575 m)   Wt 118 lb 6 oz (53.7 kg)   SpO2 96%   BMI 21.65 kg/m  BMI: Body mass index is 21.65 kg/m.  GEN- The patient is well appearing, alert and oriented x 3 today.   Lungs- Clear to ausculation bilaterally, normal work of breathing.  Heart- Regular rate and rhythm, no murmurs, rubs or gallops Extremities- No peripheral edema, warm, dry Skin-  device pocket well-healed, no tethering   Device interrogation done today and reviewed by myself:  Battery 9+ years Lead thresholds, impedence, sensing stable  No  episodes No changes made today  EKG is not ordered. Personal review of EKG from  06/05/2023  shows:  As-VP, rate 85 QRS        Recent Labs: 06/05/2023: ALT 16  06/05/2023: Chol/HDL Ratio 4.1; Cholesterol, Total 226; HDL 55; LDL Chol Calc (NIH) 141; Triglycerides 166   CrCl cannot be calculated (Patient's most recent lab result is older than the maximum 21 days allowed.).   Wt Readings from Last 3 Encounters:  07/05/23 118 lb 6 oz (53.7 kg)  06/05/23 117 lb 6.4 oz (53.3 kg)  10/10/22 121 lb (54.9 kg)     Additional  studies reviewed include: Previous EP, cardiology notes.   Limited TTE, 04/20/2021  1. Left ventricular ejection fraction, by estimation, is 55 to 60%. The left ventricle has normal function. The left ventricle has no regional wall motion abnormalities.   2. Right ventricular systolic function is normal. The right ventricular size is normal.   3. The aortic valve was not well visualized. Aortic valve regurgitation is not visualized.   TTE, 03/05/2021  1. Findings consistent with Takatsubo stress induced DCM. Akinesis of mid and apical walls with hyperdyanamic basal function No mural apical thrombus with or without definity . Left ventricular ejection fraction, by estimation, is 25 to 30%. The left ventricle has severely decreased function. The left ventricle has no regional wall motion abnormalities. Left ventricular diastolic parameters are consistent with Grade I diastolic dysfunction (impaired relaxation).   2. Right ventricular systolic function is normal. The right ventricular size is normal.   3. The pericardial effusion is surrounding the apex and anterior to the right ventricle.   4. The mitral valve is abnormal. Trivial mitral valve regurgitation. No evidence of mitral stenosis.   5. The aortic valve is tricuspid. There is mild calcification of the aortic valve. Aortic valve regurgitation is not visualized. Mild aortic valve sclerosis is present, with no evidence of aortic valve stenosis.   6. The inferior vena cava is normal in size with greater than 50% respiratory variability, suggesting right atrial pressure of 3 mmHg.   LHC, 03/04/2021 Conclusions: Moderate single-vessel coronary artery disease with 50 to 60% proximal LAD stenosis. Severely reduced LVEF with mid and apical hypokinesis/akinesis and hyperdynamic basilar contraction.  Findings are consistent with stress-induced cardiomyopathy. Moderately-severely elevated left ventricular filling pressure (LVEDP ~35 mmHg).   Recommendations: Admit to ICU for monitoring and medical optimization. Gentle diuresis. Add low-dose metoprolol as blood pressure tolerates. If blood pressure and renal function allow, consider addition of ACE inhibitor/ARB as soon as tomorrow. Aspirin and statin therapy to prevent progression of LAD disease. Obtain echocardiogram tomorrow with particular attention to the LV apex, as the patient is at risk for thrombus formation.   ASSESSMENT AND PLAN:  #) CHB s/p PPM Device functioning well, see paceart for details High VP  #) HFimpEF Warm and euvolemic on exam No SOB or activity restrictions  #) orthostatic dizziness Patients symptoms consistent with orthostasis. We discussed activity modifications including standing and waiting prior to beginning activity, staying well-hydrated Continue to monitor      Current medicines are reviewed at length with the patient today.   The patient does not have concerns regarding her medicines.  The following changes were made today:  none  Labs/ tests ordered today include:  No orders of the defined types were placed in this encounter.    Disposition: Follow up with Dr. Lalla Brothers or EP APP in 12 months   Signed, Sherie Don, NP  07/05/23  11:58 AM  Electrophysiology [facility]

## 2023-07-05 ENCOUNTER — Encounter: Payer: Self-pay | Admitting: Cardiology

## 2023-07-05 ENCOUNTER — Ambulatory Visit: Payer: PPO | Attending: Cardiology | Admitting: Cardiology

## 2023-07-05 VITALS — BP 120/50 | HR 82 | Ht 62.0 in | Wt 118.4 lb

## 2023-07-05 DIAGNOSIS — I442 Atrioventricular block, complete: Secondary | ICD-10-CM | POA: Diagnosis not present

## 2023-07-05 DIAGNOSIS — Z950 Presence of cardiac pacemaker: Secondary | ICD-10-CM | POA: Diagnosis not present

## 2023-07-05 DIAGNOSIS — R42 Dizziness and giddiness: Secondary | ICD-10-CM | POA: Diagnosis not present

## 2023-07-05 DIAGNOSIS — I5032 Chronic diastolic (congestive) heart failure: Secondary | ICD-10-CM | POA: Diagnosis not present

## 2023-07-05 LAB — CUP PACEART INCLINIC DEVICE CHECK
Date Time Interrogation Session: 20241107120943
Implantable Lead Connection Status: 753985
Implantable Lead Connection Status: 753985
Implantable Lead Implant Date: 20221010
Implantable Lead Implant Date: 20221010
Implantable Lead Location: 753859
Implantable Lead Location: 753860
Implantable Lead Model: 377171
Implantable Lead Model: 377171
Implantable Lead Serial Number: 8000510718
Implantable Lead Serial Number: 8000555079
Implantable Pulse Generator Implant Date: 20221010
Pulse Gen Model: 407145
Pulse Gen Serial Number: 70231665

## 2023-07-05 NOTE — Patient Instructions (Signed)
Medication Instructions:  The current medical regimen is effective;  continue present plan and medications.  *If you need a refill on your cardiac medications before your next appointment, please call your pharmacy*   Follow-Up: At [facility], you and your health needs are our priority.  As part of our continuing mission to provide you with exceptional heart care, we have created designated Provider Care Teams.  These Care Teams include your primary Cardiologist (physician) and Advanced Practice Providers (APPs -  Physician Assistants and Nurse Practitioners) who all work together to provide you with the care you need, when you need it.  We recommend signing up for the patient portal called "MyChart".  Sign up information is provided on this After Visit Summary.  MyChart is used to connect with patients for Virtual Visits (Telemedicine).  Patients are able to view lab/test results, encounter notes, upcoming appointments, etc.  Non-urgent messages can be sent to your provider as well.   To learn more about what you can do with MyChart, go to [email or web address].    Your next appointment:   12 month(s)  Provider:   Steffanie Dunn, MD or Sherie Don, NP

## 2023-07-07 ENCOUNTER — Other Ambulatory Visit: Payer: Self-pay | Admitting: Physician Assistant

## 2023-09-04 ENCOUNTER — Ambulatory Visit (INDEPENDENT_AMBULATORY_CARE_PROVIDER_SITE_OTHER): Payer: PPO

## 2023-09-04 DIAGNOSIS — I442 Atrioventricular block, complete: Secondary | ICD-10-CM | POA: Diagnosis not present

## 2023-09-05 LAB — CUP PACEART REMOTE DEVICE CHECK
Date Time Interrogation Session: 20250107034618
Implantable Lead Connection Status: 753985
Implantable Lead Connection Status: 753985
Implantable Lead Implant Date: 20221010
Implantable Lead Implant Date: 20221010
Implantable Lead Location: 753859
Implantable Lead Location: 753860
Implantable Lead Model: 377171
Implantable Lead Model: 377171
Implantable Lead Serial Number: 8000510718
Implantable Lead Serial Number: 8000555079
Implantable Pulse Generator Implant Date: 20221010
Pulse Gen Model: 407145
Pulse Gen Serial Number: 70231665

## 2023-10-16 NOTE — Addendum Note (Signed)
 Addended by: Geralyn Flash D on: 10/16/2023 11:23 AM   Modules accepted: Orders

## 2023-10-16 NOTE — Progress Notes (Signed)
 Remote pacemaker transmission.

## 2023-11-02 DIAGNOSIS — S39012A Strain of muscle, fascia and tendon of lower back, initial encounter: Secondary | ICD-10-CM | POA: Diagnosis not present

## 2023-11-02 DIAGNOSIS — M545 Low back pain, unspecified: Secondary | ICD-10-CM | POA: Diagnosis not present

## 2023-11-09 DIAGNOSIS — M545 Low back pain, unspecified: Secondary | ICD-10-CM | POA: Diagnosis not present

## 2023-11-09 DIAGNOSIS — K5909 Other constipation: Secondary | ICD-10-CM | POA: Diagnosis not present

## 2023-12-04 ENCOUNTER — Ambulatory Visit (INDEPENDENT_AMBULATORY_CARE_PROVIDER_SITE_OTHER): Payer: PPO

## 2023-12-04 DIAGNOSIS — I442 Atrioventricular block, complete: Secondary | ICD-10-CM

## 2023-12-05 LAB — CUP PACEART REMOTE DEVICE CHECK
Battery Voltage: 80
Date Time Interrogation Session: 20250408093728
Implantable Lead Connection Status: 753985
Implantable Lead Connection Status: 753985
Implantable Lead Implant Date: 20221010
Implantable Lead Implant Date: 20221010
Implantable Lead Location: 753859
Implantable Lead Location: 753860
Implantable Lead Model: 377171
Implantable Lead Model: 377171
Implantable Lead Serial Number: 8000510718
Implantable Lead Serial Number: 8000555079
Implantable Pulse Generator Implant Date: 20221010
Pulse Gen Model: 407145
Pulse Gen Serial Number: 70231665

## 2023-12-08 ENCOUNTER — Encounter: Payer: Self-pay | Admitting: Cardiology

## 2023-12-11 NOTE — Progress Notes (Unsigned)
 Cardiology Office Note    Date:  12/12/2023   ID:  Lori, Rosario March 31, 1941, MRN [id number]  PCP:  Lori Goodell, MD  Cardiologist:  Lori Kendall, MD  Electrophysiologist:  Lori Prude, MD   Chief Complaint: Follow-up  History of Present Illness:   Lori Rosario is a 83 y.o. female with history of CAD, HFrEF secondary to stress-induced cardiomyopathy with normalization of LV systolic function by echo in 03/2021, syncope with complete heart block status post PPM on 06/06/2021, hyperthyroidism status post radioactive iodine therapy in her 30s now on replacement therapy, and HLD who presents for follow-up of her cardiomyopathy.   Prior to her admission in 02/2021 she did not have any known cardiac disease.  Initially, she presented to an urgent care with right otalgia.  Prior to leaving her appointment she developed substernal chest pain without radiation or associated symptoms.  EKG showed inferior lateral ST elevation.  In this setting she was transferred to [facility] ED.  She underwent emergent LHC which showed moderate single-vessel CAD with 50 to 60% proximal LAD stenosis, severely reduced LVEF with mid and apical hypokinesis/akinesis and hyperdynamic basal contraction with findings consistent with stress-induced cardiomyopathy, moderately to severely elevated LVEDP of approximately 35 mmHg.  Medical management and diuresis was recommended.  Echo on 03/05/2021 showed an EF of 25 to 30%, akinesis of mid and apical walls with hyperdynamic basal function with findings consistent with Takotsubo dilated cardiomyopathy, grade 1 diastolic dysfunction, normal RV systolic function and ventricular cavity size, a pericardial effusion surrounding the apex and anterior to the right ventricle was noted, trivial mitral regurgitation, mild aortic valve sclerosis without evidence of stenosis, and no evidence of mural apical thrombus, with or without Definity.  High-sensitivity troponin peaked at 4462.   She was discharged on low-dose Toprol-XL and as needed Lasix.  Relative hypotension precluded escalation of further GDMT.     She was seen in hospital follow-up on 04/06/2021 and was doing very well from a cardiac perspective.  She was ambulating one third of a mile per day without issues.  She was tolerating medications.  Repeat limited echo on 04/20/2021 showed normalization of LV systolic function with an EF of 55 to 60%, no regional wall motion abnormalities, normal RV systolic function and ventricular cavity size, and no significant valvular abnormalities.  Following her office visit in 03/2021, metoprolol was discontinued as there was concern this was contributing to her dizziness.   She was evaluated in the ED on 05/06/2021 with persistent dizziness.  Laboratory evaluation was unrevealing including high-sensitivity troponin.  MRI brain was nonacute.  EKG demonstrated sinus rhythm with known first-degree AV block.  Treated with a dose of meclizine with improvement in symptoms.  She was evaluated by ENT with symptoms felt to be related to vertigo.   She was seen in the office in 04/2021 and continued to note intermittent dizziness described as a room spinning sensation.  She reported meclizine had led to resolution of these episodes.  Home BP/HR log showed BP readings from 95-148/59-78 with heart rates ranging from 56 to 101 bpm with the majority of her BP readings in the 120s to 130s systolic and heart rates in the 80s to 90s bpm.  Given symptoms, she underwent outpatient cardiac monitoring, which demonstrated two episodes of complete heart block with heart rates ranging between 34 and 37 bpm with episodes persisting approximately 7 to 8 seconds.  There were 4 episodes of high-grade AV block.  Upon our  office contacting the patient, she had not had any further syncope.  She reported her neurologist had recommended she discontinue atorvastatin, and with this her dizziness had improved.  She was referred to EP.   However, prior to this appointment, she was admitted to the hospital in 05/2021 with syncope.  She was transferred to [facility] and underwent dual-chamber pacemaker implantation on 06/06/2021.  In follow-ups thereafter she has been doing very well from a cardiac perspective and remains active without cardiac limitation.  She is in today continuing to do well from a cardiac perspective, without symptoms of angina or cardiac decompensation.  No presyncope or syncope.  No falls or symptoms concerning for bleeding.  She remains active at baseline walking on a regular basis without significant cardiac limitation.  No lower extremity swelling, abdominal distention, or progressive orthopnea.  Continues to eat a heart healthy diet with good appetite.  Overall continues to feel well and does not have any acute cardiac concerns at this time.   Labs independently reviewed: 05/2023 - Hgb 14.2, PLT 283, potassium 4.5, BUN 15, serum creatinine 0.7, albumin 4, AST/ALT normal, TC 210, TG 148, HDL 55, LDL 125, TSH normal  Past Medical History:  Diagnosis Date   CAD (coronary artery disease)    Nonobstructive CAD   Complete heart block (HCC) 05/2021   s/p PPM   HFrEF (heart failure with reduced ejection fraction) (HCC)    Hyperlipidemia LDL goal <70    Takotsubo cardiomyopathy    Thyroid disease     Past Surgical History:  Procedure Laterality Date   COLONOSCOPY  2013   cleared for 10 yrs- Dr @ [facility]   INGUINAL HERNIA REPAIR     LEFT HEART CATH AND CORONARY ANGIOGRAPHY N/A 03/04/2021   Procedure: LEFT HEART CATH AND CORONARY ANGIOGRAPHY;  Surgeon: Sammy Crisp, MD;  Location: [facility] INVASIVE CV LAB;  Service: Cardiovascular;  Laterality: N/A;   PACEMAKER IMPLANT N/A 06/06/2021   Procedure: PACEMAKER IMPLANT;  Surgeon: Boyce Byes, MD;  Location: [facility] INVASIVE CV LAB;  Service: Cardiovascular;  Laterality: N/A;    Current Medications: Current Meds  Medication Sig   aspirin EC (ASPIRIN LOW DOSE) 81  MG tablet TAKE 1 TABLET(81 MG) BY MOUTH DAILY. SWALLOW WHOLE   Calcium Citrate-Vitamin D (CALCIUM CITRATE +D PO) Take 1 tablet by mouth every morning.   Cholecalciferol (VITAMIN D3 PO) Take 1 tablet by mouth every morning.   Multiple Vitamin (MULTIVITAMIN WITH MINERALS) TABS tablet Take 1 tablet by mouth every morning.   SYNTHROID 137 MCG tablet Take 68.5 mcg by mouth at bedtime.    Allergies:   Atorvastatin   Social History   Socioeconomic History   Marital status: Married    Spouse name: Not on file   Number of children: Not on file   Years of education: Not on file   Highest education level: Not on file  Occupational History   Not on file  Tobacco Use   Smoking status: Never   Smokeless tobacco: Never  Vaping Use   Vaping status: Never Used  Substance and Sexual Activity   Alcohol use: No    Alcohol/week: 0.0 standard drinks of alcohol   Drug use: No   Sexual activity: Not Currently  Other Topics Concern   Not on file  Social History Narrative   Not on file   Social Drivers of Health   Financial Resource Strain: Low Risk  (06/19/2023)   Received from [facility]   Overall  Financial Resource Strain (CARDIA)    Difficulty of Paying Living Expenses: Not hard at all  Food Insecurity: No Food Insecurity (06/19/2023)   Received from [facility]   Hunger Vital Sign    Worried About Running Out of Food in the Last Year: Never true    Ran Out of Food in the Last Year: Never true  Transportation Needs: No Transportation Needs (06/19/2023)   Received from [facility] - Transportation    In the past 12 months, has lack of transportation kept you from medical appointments or from getting medications?: No    Lack of Transportation (Non-Medical): No  Physical Activity: Not on file  Stress: Not on file  Social Connections: Not on file     Family History:  The patient's family history includes Heart disease in  her father.  ROS:   12-point review of systems is negative unless otherwise noted in the HPI.   EKGs/Labs/Other Studies Reviewed:    Studies reviewed were summarized above. The additional studies were reviewed today:  LHC 03/04/2021: Conclusions: Moderate single-vessel coronary artery disease with 50 to 60% proximal LAD stenosis. Severely reduced LVEF with mid and apical hypokinesis/akinesis and hyperdynamic basilar contraction.  Findings are consistent with stress-induced cardiomyopathy. Moderately-severely elevated left ventricular filling pressure (LVEDP ~35 mmHg).   Recommendations: Admit to ICU for monitoring and medical optimization. Gentle diuresis. Add low-dose metoprolol as blood pressure tolerates. If blood pressure and renal function allow, consider addition of ACE inhibitor/ARB as soon as tomorrow. Aspirin and statin therapy to prevent progression of LAD disease. Obtain echocardiogram tomorrow with particular attention to the LV apex, as the patient is at risk for thrombus formation. __________   2D echo 03/05/2021: 1. Findings consistent with Takatsubo stress induced DCM. Akinesis of mid  and apical walls with hyperdyanamic basal function No mural apical  thrombus with or without definity . Left ventricular ejection fraction, by  estimation, is 25 to 30%. The left  ventricle has severely decreased function. The left ventricle has no  regional wall motion abnormalities. Left ventricular diastolic parameters  are consistent with Grade I diastolic dysfunction (impaired relaxation).   2. Right ventricular systolic function is normal. The right ventricular  size is normal.   3. The pericardial effusion is surrounding the apex and anterior to the  right ventricle.   4. The mitral valve is abnormal. Trivial mitral valve regurgitation. No  evidence of mitral stenosis.   5. The aortic valve is tricuspid. There is mild calcification of the  aortic valve. Aortic valve  regurgitation is not visualized. Mild aortic  valve sclerosis is present, with no evidence of aortic valve stenosis.   6. The inferior vena cava is normal in size with greater than 50%  respiratory variability, suggesting right atrial pressure of 3 mmHg. __________   Limited echo 04/20/2021: 1. Left ventricular ejection fraction, by estimation, is 55 to 60%. The  left ventricle has normal function. The left ventricle has no regional  wall motion abnormalities.   2. Right ventricular systolic function is normal. The right ventricular  size is normal.   3. The aortic valve was not well visualized. Aortic valve regurgitation  is not visualized.  __________   Zio patch 04/2021: HR 21 - 115, average 85bpm. 1151 pauses, longest lasting 10 seconds. High degree AV block was present. Rare supraventricular and ventricular ectopy.   EKG:  EKG is ordered today.  The EKG ordered today demonstrates A-sensed, V-paced rhythm, 94  bpm  Recent Labs: 06/05/2023: ALT 16  Recent Lipid Panel    Component Value Date/Time   CHOL 226 (H) 06/05/2023 0846   TRIG 166 (H) 06/05/2023 0846   HDL 55 06/05/2023 0846   CHOLHDL 4.1 06/05/2023 0846   CHOLHDL 4.2 03/04/2021 1613   VLDL 31 03/04/2021 1613   LDLCALC 141 (H) 06/05/2023 0846    PHYSICAL EXAM:    VS:  BP 118/70 (BP Location: Left Arm, Patient Position: Sitting, Cuff Size: Normal)   Pulse 93   Ht 5\' 2"  (1.575 m)   Wt 116 lb 9.6 oz (52.9 kg)   SpO2 97%   BMI 21.33 kg/m   BMI: Body mass index is 21.33 kg/m.  Physical Exam Vitals reviewed.  Constitutional:      Appearance: She is well-developed.  HENT:     Head: Normocephalic and atraumatic.  Eyes:     General:        Right eye: No discharge.        Left eye: No discharge.  Cardiovascular:     Rate and Rhythm: Normal rate and regular rhythm.     Heart sounds: Normal heart sounds, S1 normal and S2 normal. Heart sounds not distant. No midsystolic click and no opening snap. No murmur  heard.    No friction rub.  Pulmonary:     Effort: Pulmonary effort is normal. No respiratory distress.     Breath sounds: Normal breath sounds. No decreased breath sounds, wheezing, rhonchi or rales.  Chest:     Chest wall: No tenderness.  Musculoskeletal:     Cervical back: Normal range of motion.     Right lower leg: No edema.     Left lower leg: No edema.  Skin:    General: Skin is warm and dry.     Nails: There is no clubbing.  Neurological:     Mental Status: She is alert and oriented to person, place, and time.  Psychiatric:        Speech: Speech normal.        Behavior: Behavior normal.        Thought Content: Thought content normal.        Judgment: Judgment normal.     Wt Readings from Last 3 Encounters:  12/12/23 116 lb 9.6 oz (52.9 kg)  07/05/23 118 lb 6 oz (53.7 kg)  06/05/23 117 lb 6.4 oz (53.3 kg)     ASSESSMENT & PLAN:   Stress-induced cardiomyopathy with subsequent normalization of LV systolic function: She is euvolemic and well compensated with NYHA class I symptoms.  Relative hypotension has limited escalation of pharmacotherapy and led to the ultimate discontinuation of evidence-based medical therapy.  Given lack of failure symptoms, and in the context of normalization of LV systolic function with history of complete heart block status post PPM, defer rechallenge of beta-blocker or ACE inhibitor at this time.  Not requiring a standing loop diuretic.  No further workup is indicated at this time.  Complete heart block: Status post PPM.  Followed by EP.  Nonobstructive CAD/HLD with statin intolerance: Tolerating rosuvastatin 5 mg daily.  Prefers to defer escalation of statin at this time.  Check LFT, lipid panel, and direct LDL.  Continue with heart healthy diet.    Disposition: F/u with Dr. Nolan Battle or an APP in 6 months, and EP as directed.   Medication Adjustments/Labs and Tests Ordered: Current medicines are reviewed at length with the patient today.   Concerns regarding medicines are outlined above.  Medication changes, Labs and Tests ordered today are summarized above and listed in the Patient Instructions accessible in Encounters.   Signed, Varney Gentleman, PA-C 12/12/2023 5:31 PM     [facility] [place] [address] [place], [place] [address] [phone]

## 2023-12-12 ENCOUNTER — Ambulatory Visit: Attending: Physician Assistant | Admitting: Physician Assistant

## 2023-12-12 ENCOUNTER — Encounter: Payer: Self-pay | Admitting: Physician Assistant

## 2023-12-12 VITALS — BP 118/70 | HR 93 | Ht 62.0 in | Wt 116.6 lb

## 2023-12-12 DIAGNOSIS — Z789 Other specified health status: Secondary | ICD-10-CM

## 2023-12-12 DIAGNOSIS — Z950 Presence of cardiac pacemaker: Secondary | ICD-10-CM | POA: Diagnosis not present

## 2023-12-12 DIAGNOSIS — I442 Atrioventricular block, complete: Secondary | ICD-10-CM

## 2023-12-12 DIAGNOSIS — E785 Hyperlipidemia, unspecified: Secondary | ICD-10-CM

## 2023-12-12 DIAGNOSIS — Z79899 Other long term (current) drug therapy: Secondary | ICD-10-CM | POA: Diagnosis not present

## 2023-12-12 DIAGNOSIS — I5032 Chronic diastolic (congestive) heart failure: Secondary | ICD-10-CM

## 2023-12-12 NOTE — Patient Instructions (Signed)
 Medication Instructions:  Your Physician recommend you continue on your current medication as directed.    *If you need a refill on your cardiac medications before your next appointment, please call your pharmacy*  Lab Work: Your provider would like for you to have following labs drawn today Lipid panel, Direct LDL, and Liver function test.   If you have labs (blood work) drawn today and your tests are completely normal, you will receive your results only by: MyChart Message (if you have MyChart) OR A paper copy in the mail If you have any lab test that is abnormal or we need to change your treatment, we will call you to review the results.  Testing/Procedures: None ordered at this time   Follow-Up: At [facility], you and your health needs are our priority.  As part of our continuing mission to provide you with exceptional heart care, our providers are all part of one team.  This team includes your primary Cardiologist (physician) and Advanced Practice Providers or APPs (Physician Assistants and Nurse Practitioners) who all work together to provide you with the care you need, when you need it.  Your next appointment:   6 month(s)  Provider:   You may see Sammy Crisp, MD or Varney Gentleman, PA-C

## 2023-12-13 LAB — HEPATIC FUNCTION PANEL
ALT: 27 IU/L (ref 0–32)
AST: 26 IU/L (ref 0–40)
Albumin: 4.2 g/dL (ref 3.7–4.7)
Alkaline Phosphatase: 100 IU/L (ref 44–121)
Bilirubin Total: 0.3 mg/dL (ref 0.0–1.2)
Bilirubin, Direct: 0.09 mg/dL (ref 0.00–0.40)
Total Protein: 7 g/dL (ref 6.0–8.5)

## 2023-12-13 LAB — LIPID PANEL
Chol/HDL Ratio: 3 ratio (ref 0.0–4.4)
Cholesterol, Total: 157 mg/dL (ref 100–199)
HDL: 53 mg/dL (ref >39)
LDL Chol Calc (NIH): 76 mg/dL (ref 0–99)
Triglycerides: 165 mg/dL — ABNORMAL HIGH (ref 0–149)
VLDL Cholesterol Cal: 28 mg/dL (ref 5–40)

## 2023-12-13 LAB — LDL CHOLESTEROL, DIRECT: LDL Direct: 82 mg/dL (ref 0–99)

## 2023-12-20 ENCOUNTER — Encounter: Payer: Self-pay | Admitting: Emergency Medicine

## 2024-01-22 NOTE — Addendum Note (Signed)
 Addended by: Lott Rouleau A on: 01/22/2024 09:31 AM   Modules accepted: Orders

## 2024-01-22 NOTE — Progress Notes (Signed)
 Remote pacemaker transmission.

## 2024-03-04 ENCOUNTER — Ambulatory Visit: Payer: PPO

## 2024-03-04 DIAGNOSIS — I442 Atrioventricular block, complete: Secondary | ICD-10-CM

## 2024-03-05 DIAGNOSIS — L738 Other specified follicular disorders: Secondary | ICD-10-CM | POA: Diagnosis not present

## 2024-03-05 DIAGNOSIS — D2261 Melanocytic nevi of right upper limb, including shoulder: Secondary | ICD-10-CM | POA: Diagnosis not present

## 2024-03-05 DIAGNOSIS — L988 Other specified disorders of the skin and subcutaneous tissue: Secondary | ICD-10-CM | POA: Diagnosis not present

## 2024-03-05 DIAGNOSIS — D2272 Melanocytic nevi of left lower limb, including hip: Secondary | ICD-10-CM | POA: Diagnosis not present

## 2024-03-05 DIAGNOSIS — D225 Melanocytic nevi of trunk: Secondary | ICD-10-CM | POA: Diagnosis not present

## 2024-03-05 DIAGNOSIS — D2271 Melanocytic nevi of right lower limb, including hip: Secondary | ICD-10-CM | POA: Diagnosis not present

## 2024-03-05 DIAGNOSIS — L821 Other seborrheic keratosis: Secondary | ICD-10-CM | POA: Diagnosis not present

## 2024-03-05 DIAGNOSIS — D2262 Melanocytic nevi of left upper limb, including shoulder: Secondary | ICD-10-CM | POA: Diagnosis not present

## 2024-03-05 LAB — CUP PACEART REMOTE DEVICE CHECK
Date Time Interrogation Session: 20250708091649
Implantable Lead Connection Status: 753985
Implantable Lead Connection Status: 753985
Implantable Lead Implant Date: 20221010
Implantable Lead Implant Date: 20221010
Implantable Lead Location: 753859
Implantable Lead Location: 753860
Implantable Lead Model: 377171
Implantable Lead Model: 377171
Implantable Lead Serial Number: 8000510718
Implantable Lead Serial Number: 8000555079
Implantable Pulse Generator Implant Date: 20221010
Pulse Gen Model: 407145
Pulse Gen Serial Number: 70231665

## 2024-03-08 ENCOUNTER — Ambulatory Visit: Payer: Self-pay | Admitting: Cardiology

## 2024-04-07 DIAGNOSIS — H2513 Age-related nuclear cataract, bilateral: Secondary | ICD-10-CM | POA: Diagnosis not present

## 2024-04-17 DIAGNOSIS — H2511 Age-related nuclear cataract, right eye: Secondary | ICD-10-CM | POA: Diagnosis not present

## 2024-04-17 DIAGNOSIS — H25811 Combined forms of age-related cataract, right eye: Secondary | ICD-10-CM | POA: Diagnosis not present

## 2024-04-17 DIAGNOSIS — E039 Hypothyroidism, unspecified: Secondary | ICD-10-CM | POA: Diagnosis not present

## 2024-05-19 ENCOUNTER — Other Ambulatory Visit: Payer: Self-pay | Admitting: Physician Assistant

## 2024-05-23 ENCOUNTER — Other Ambulatory Visit: Payer: Self-pay | Admitting: Physician Assistant

## 2024-06-03 ENCOUNTER — Ambulatory Visit: Payer: PPO

## 2024-06-03 DIAGNOSIS — I442 Atrioventricular block, complete: Secondary | ICD-10-CM | POA: Diagnosis not present

## 2024-06-04 LAB — CUP PACEART REMOTE DEVICE CHECK
Date Time Interrogation Session: 20251007091245
Implantable Lead Connection Status: 753985
Implantable Lead Connection Status: 753985
Implantable Lead Implant Date: 20221010
Implantable Lead Implant Date: 20221010
Implantable Lead Location: 753859
Implantable Lead Location: 753860
Implantable Lead Model: 377171
Implantable Lead Model: 377171
Implantable Lead Serial Number: 8000510718
Implantable Lead Serial Number: 8000555079
Implantable Pulse Generator Implant Date: 20221010
Pulse Gen Model: 407145
Pulse Gen Serial Number: 70231665

## 2024-06-06 NOTE — Progress Notes (Signed)
 Remote PPM Transmission

## 2024-06-09 ENCOUNTER — Ambulatory Visit: Payer: Self-pay | Admitting: Cardiology

## 2024-06-09 ENCOUNTER — Ambulatory Visit: Attending: Physician Assistant | Admitting: Physician Assistant

## 2024-06-09 ENCOUNTER — Encounter: Payer: Self-pay | Admitting: Physician Assistant

## 2024-06-09 VITALS — BP 120/58 | HR 76 | Ht 62.0 in | Wt 111.5 lb

## 2024-06-09 DIAGNOSIS — Z789 Other specified health status: Secondary | ICD-10-CM | POA: Diagnosis not present

## 2024-06-09 DIAGNOSIS — Z950 Presence of cardiac pacemaker: Secondary | ICD-10-CM

## 2024-06-09 DIAGNOSIS — I442 Atrioventricular block, complete: Secondary | ICD-10-CM

## 2024-06-09 DIAGNOSIS — E785 Hyperlipidemia, unspecified: Secondary | ICD-10-CM

## 2024-06-09 DIAGNOSIS — I5181 Takotsubo syndrome: Secondary | ICD-10-CM | POA: Diagnosis not present

## 2024-06-09 DIAGNOSIS — I5020 Unspecified systolic (congestive) heart failure: Secondary | ICD-10-CM

## 2024-06-09 MED ORDER — ROSUVASTATIN CALCIUM 5 MG PO TABS: 5.0000 mg | ORAL_TABLET | Freq: Every day | ORAL | 3 refills | Status: AC

## 2024-06-09 NOTE — Patient Instructions (Signed)
 Medication Instructions:  Your physician recommends that you continue on your current medications as directed. Please refer to the Current Medication list given to you today.   *If you need a refill on your cardiac medications before your next appointment, please call your pharmacy*  Lab Work: None ordered at this time   Follow-Up: At [facility], you and your health needs are our priority.  As part of our continuing mission to provide you with exceptional heart care, our providers are all part of one team.  This team includes your primary Cardiologist (physician) and Advanced Practice Providers or APPs (Physician Assistants and Nurse Practitioners) who all work together to provide you with the care you need, when you need it.  Your next appointment:   6 month(s)  Provider:   You may see Sammy Crisp, MD or Varney Gentleman, PA-C

## 2024-06-09 NOTE — Progress Notes (Signed)
 Cardiology Office Note    Date:  06/09/2024   ID:  Pedro, [place] 04-06-1941, MRN [id number]  PCP:  Jeffie Cheryl BRAVO, MD  Cardiologist:  Lonni Hanson, MD  Electrophysiologist:  OLE ONEIDA HOLTS, MD   Chief Complaint: Follow-up  History of Present Illness:   Lori Rosario is a 83 y.o. female with history of CAD, HFrEF secondary to stress-induced cardiomyopathy with normalization of LV systolic function by echo in 03/2021, syncope with complete heart block status post PPM on 06/06/2021, hyperthyroidism status post radioactive iodine therapy in her 30s now on replacement therapy, and HLD who presents for follow-up of her cardiomyopathy.   Prior to her admission in 02/2021 she did not have any known cardiac disease.  Initially, she presented to an urgent care with right otalgia.  Prior to leaving her appointment she developed substernal chest pain without radiation or associated symptoms.  EKG showed inferior lateral ST elevation.  In this setting she was transferred to [facility] ED.  She underwent emergent LHC which showed moderate single-vessel CAD with 50 to 60% proximal LAD stenosis, severely reduced LVEF with mid and apical hypokinesis/akinesis and hyperdynamic basal contraction with findings consistent with stress-induced cardiomyopathy, moderately to severely elevated LVEDP of approximately 35 mmHg.  Medical management and diuresis was recommended.  Echo on 03/05/2021 showed an EF of 25 to 30%, akinesis of mid and apical walls with hyperdynamic basal function with findings consistent with Takotsubo dilated cardiomyopathy, grade 1 diastolic dysfunction, normal RV systolic function and ventricular cavity size, a pericardial effusion surrounding the apex and anterior to the right ventricle was noted, trivial mitral regurgitation, mild aortic valve sclerosis without evidence of stenosis, and no evidence of mural apical thrombus, with or without Definity .  High-sensitivity troponin peaked at 4462.   She was discharged on low-dose Toprol -XL and as needed Lasix .  Relative hypotension precluded escalation of further GDMT.     She was seen in hospital follow-up on 04/06/2021 and was doing very well from a cardiac perspective.  She was ambulating one third of a mile per day without issues.  She was tolerating medications.  Repeat limited echo on 04/20/2021 showed normalization of LV systolic function with an EF of 55 to 60%, no regional wall motion abnormalities, normal RV systolic function and ventricular cavity size, and no significant valvular abnormalities.  Following her office visit in 03/2021, metoprolol  was discontinued as there was concern this was contributing to her dizziness.   She was evaluated in the ED on 05/06/2021 with persistent dizziness.  Laboratory evaluation was unrevealing including high-sensitivity troponin.  MRI brain was nonacute.  EKG demonstrated sinus rhythm with known first-degree AV block.  Treated with a dose of meclizine  with improvement in symptoms.  She was evaluated by ENT with symptoms felt to be related to vertigo.   She was seen in the office in 04/2021 and continued to note intermittent dizziness described as a room spinning sensation.  She reported meclizine  had led to resolution of these episodes.  Home BP/HR log showed BP readings from 95-148/59-78 with heart rates ranging from 56 to 101 bpm with the majority of her BP readings in the 120s to 130s systolic and heart rates in the 80s to 90s bpm.  Given symptoms, she underwent outpatient cardiac monitoring, which demonstrated two episodes of complete heart block with heart rates ranging between 34 and 37 bpm with episodes persisting approximately 7 to 8 seconds.  There were 4 episodes of high-grade AV block.  Upon our  office contacting the patient, she had not had any further syncope.  She reported her neurologist had recommended she discontinue atorvastatin , and with this her dizziness had improved.  She was referred to EP.   However, prior to this appointment, she was admitted to the hospital in 05/2021 with syncope.  She was transferred to [facility] and underwent dual-chamber pacemaker implantation on 06/06/2021.  In follow-ups thereafter she has been doing very well from a cardiac perspective and has remained active without cardiac limitation.  She comes in today and continues to do very well from a cardiac perspective, without symptoms of angina or cardiac decompensation.  No presyncope or syncope.  She does note some mild intermittent dizziness that is fleeting.  She will also notice an occasional palpitation that is rare.  No lower extremity swelling or progressive orthopnea.  Tolerating low-dose rosuvastatin  without off target effect.  Continues to remain active at baseline without cardiac limitation.  She does not have any acute cardiac concerns at this time.   Labs independently reviewed: 11/2023 - direct LDL 82, TC 157, TG 165, HDL 53, LDL 76, albumin 4.2, AST/ALT normal 05/2023 - Hgb 14.2, PLT 283, potassium 4.5, BUN 15, serum creatinine 0.7, TSH normal   Past Medical History:  Diagnosis Date   CAD (coronary artery disease)    Nonobstructive CAD   Complete heart block (HCC) 05/2021   s/p PPM   HFrEF (heart failure with reduced ejection fraction) (HCC)    Hyperlipidemia LDL goal <70    Takotsubo cardiomyopathy    Thyroid  disease     Past Surgical History:  Procedure Laterality Date   COLONOSCOPY  2013   cleared for 10 yrs- Dr @ [facility]   INGUINAL HERNIA REPAIR     LEFT HEART CATH AND CORONARY ANGIOGRAPHY N/A 03/04/2021   Procedure: LEFT HEART CATH AND CORONARY ANGIOGRAPHY;  Surgeon: Mady Bruckner, MD;  Location: [facility] INVASIVE CV LAB;  Service: Cardiovascular;  Laterality: N/A;   PACEMAKER IMPLANT N/A 06/06/2021   Procedure: PACEMAKER IMPLANT;  Surgeon: Cindie Ole DASEN, MD;  Location: [facility] INVASIVE CV LAB;  Service: Cardiovascular;  Laterality: N/A;    Current Medications: Current Meds  Medication  Sig   aspirin  EC (ASPIRIN  LOW DOSE) 81 MG tablet TAKE 1 TABLET(81 MG) BY MOUTH DAILY. SWALLOW WHOLE   Calcium  Citrate-Vitamin D (CALCIUM  CITRATE +D PO) Take 1 tablet by mouth every morning.   Cholecalciferol (VITAMIN D3 PO) Take 1 tablet by mouth every morning.   Multiple Vitamin (MULTIVITAMIN WITH MINERALS) TABS tablet Take 1 tablet by mouth every morning.   SYNTHROID  137 MCG tablet Take 68.5 mcg by mouth at bedtime.   [DISCONTINUED] rosuvastatin  (CRESTOR ) 5 MG tablet TAKE 1 TABLET(5 MG) BY MOUTH DAILY (Patient taking differently: Take 5 mg by mouth daily.)    Allergies:   Atorvastatin    Social History   Socioeconomic History   Marital status: Married    Spouse name: Not on file   Number of children: Not on file   Years of education: Not on file   Highest education level: Not on file  Occupational History   Not on file  Tobacco Use   Smoking status: Never   Smokeless tobacco: Never  Vaping Use   Vaping status: Never Used  Substance and Sexual Activity   Alcohol use: No    Alcohol/week: 0.0 standard drinks of alcohol   Drug use: No   Sexual activity: Not Currently  Other Topics Concern   Not on file  Social History  Narrative   Not on file   Social Drivers of Health   Financial Resource Strain: Low Risk  (06/19/2023)   Received from [facility]   Overall Financial Resource Strain (CARDIA)    Difficulty of Paying Living Expenses: Not hard at all  Food Insecurity: No Food Insecurity (06/19/2023)   Received from [facility]   Hunger Vital Sign    Within the past 12 months, you worried that your food would run out before you got the money to buy more.: Never true    Within the past 12 months, the food you bought just didn't last and you didn't have money to get more.: Never true  Transportation Needs: No Transportation Needs (06/19/2023)   Received from [facility]    In the past 12 months,  has lack of transportation kept you from medical appointments or from getting medications?: No    Lack of Transportation (Non-Medical): No  Physical Activity: Not on file  Stress: Not on file  Social Connections: Not on file     Family History:  The patient's family history includes Heart disease in her father.  ROS:   12-point review of systems is negative unless otherwise noted in the HPI.   EKGs/Labs/Other Studies Reviewed:    Studies reviewed were summarized above. The additional studies were reviewed today:  LHC 03/04/2021: Conclusions: Moderate single-vessel coronary artery disease with 50 to 60% proximal LAD stenosis. Severely reduced LVEF with mid and apical hypokinesis/akinesis and hyperdynamic basilar contraction.  Findings are consistent with stress-induced cardiomyopathy. Moderately-severely elevated left ventricular filling pressure (LVEDP ~35 mmHg).   Recommendations: Admit to ICU for monitoring and medical optimization. Gentle diuresis. Add low-dose metoprolol  as blood pressure tolerates. If blood pressure and renal function allow, consider addition of ACE inhibitor/ARB as soon as tomorrow. Aspirin  and statin therapy to prevent progression of LAD disease. Obtain echocardiogram tomorrow with particular attention to the LV apex, as the patient is at risk for thrombus formation. __________   2D echo 03/05/2021: 1. Findings consistent with Takatsubo stress induced DCM. Akinesis of mid  and apical walls with hyperdyanamic basal function No mural apical  thrombus with or without definity  . Left ventricular ejection fraction, by  estimation, is 25 to 30%. The left  ventricle has severely decreased function. The left ventricle has no  regional wall motion abnormalities. Left ventricular diastolic parameters  are consistent with Grade I diastolic dysfunction (impaired relaxation).   2. Right ventricular systolic function is normal. The right ventricular  size is normal.    3. The pericardial effusion is surrounding the apex and anterior to the  right ventricle.   4. The mitral valve is abnormal. Trivial mitral valve regurgitation. No  evidence of mitral stenosis.   5. The aortic valve is tricuspid. There is mild calcification of the  aortic valve. Aortic valve regurgitation is not visualized. Mild aortic  valve sclerosis is present, with no evidence of aortic valve stenosis.   6. The inferior vena cava is normal in size with greater than 50%  respiratory variability, suggesting right atrial pressure of 3 mmHg. __________   Limited echo 04/20/2021: 1. Left ventricular ejection fraction, by estimation, is 55 to 60%. The  left ventricle has normal function. The left ventricle has no regional  wall motion abnormalities.   2. Right ventricular systolic function is normal. The right ventricular  size is normal.   3. The aortic valve was not well visualized.  Aortic valve regurgitation  is not visualized.  __________   Zio patch 04/2021: HR 21 - 115, average 85bpm. 1151 pauses, longest lasting 10 seconds. High degree AV block was present. Rare supraventricular and ventricular ectopy.   EKG:  EKG is ordered today.  The EKG ordered today demonstrates A-sensed, V-paced rhythm with prolonged AV conduction, 76 bpm  Recent Labs: 12/12/2023: ALT 27  Recent Lipid Panel    Component Value Date/Time   CHOL 157 12/12/2023 1541   TRIG 165 (H) 12/12/2023 1541   HDL 53 12/12/2023 1541   CHOLHDL 3.0 12/12/2023 1541   CHOLHDL 4.2 03/04/2021 1613   VLDL 31 03/04/2021 1613   LDLCALC 76 12/12/2023 1541   LDLDIRECT 82 12/12/2023 1541    PHYSICAL EXAM:    VS:  BP (!) 120/58 (BP Location: Left Arm, Patient Position: Sitting, Cuff Size: Normal)   Pulse 76   Ht 5' 2 (1.575 m)   Wt 111 lb 8 oz (50.6 kg)   SpO2 97%   BMI 20.39 kg/m   BMI: Body mass index is 20.39 kg/m.  Physical Exam Vitals reviewed.  Constitutional:      Appearance: She is well-developed.   HENT:     Head: Normocephalic and atraumatic.  Eyes:     General:        Right eye: No discharge.        Left eye: No discharge.  Cardiovascular:     Rate and Rhythm: Normal rate and regular rhythm.     Pulses:          Posterior tibial pulses are 2+ on the right side and 2+ on the left side.     Heart sounds: Normal heart sounds, S1 normal and S2 normal. Heart sounds not distant. No midsystolic click and no opening snap. No murmur heard.    No friction rub.  Pulmonary:     Effort: Pulmonary effort is normal. No respiratory distress.     Breath sounds: Normal breath sounds. No decreased breath sounds, wheezing, rhonchi or rales.  Musculoskeletal:     Cervical back: Normal range of motion.     Right lower leg: No edema.     Left lower leg: No edema.  Skin:    General: Skin is warm and dry.     Nails: There is no clubbing.  Neurological:     Mental Status: She is alert and oriented to person, place, and time.  Psychiatric:        Speech: Speech normal.        Behavior: Behavior normal.        Thought Content: Thought content normal.        Judgment: Judgment normal.     Wt Readings from Last 3 Encounters:  06/09/24 111 lb 8 oz (50.6 kg)  12/12/23 116 lb 9.6 oz (52.9 kg)  07/05/23 118 lb 6 oz (53.7 kg)     ASSESSMENT & PLAN:   Stress-induced cardiomyopathy with subsequent normalization of LV systolic function: She continues to do very well.  She is euvolemic and well compensated with NYHA class I symptoms.  Relative hypotension has limited escalation of pharmacotherapy and led to the discontinuation of evidence-based medical therapy previously.  Given lack of heart failure symptoms, and in the context of normalization of LV systolic function with history of complete heart block status post PPM, defer escalation of pharmacotherapy at this time.  Not requiring standing loop diuretic.  No further workup indicated currently.  Complete heart block: Status  post PPM.  Followed by  EP.  Nonobstructive CAD/HLD with statin intolerance: She is without symptoms of angina or cardiac decompensation.  Remains on aspirin  81 mg and rosuvastatin  5 mg.  Continue with heart healthy diet and active lifestyle.     Disposition: F/u with Dr. Mady or an APP in 6 months, and EP as directed.    Medication Adjustments/Labs and Tests Ordered: Current medicines are reviewed at length with the patient today.  Concerns regarding medicines are outlined above. Medication changes, Labs and Tests ordered today are summarized above and listed in the Patient Instructions accessible in Encounters.   Signed, Bernardino Bring, PA-C 06/09/2024 10:10 AM     [facility] [place] [address] [place], [place] [address] [phone]

## 2024-06-12 DIAGNOSIS — Z0000 Encounter for general adult medical examination without abnormal findings: Secondary | ICD-10-CM | POA: Diagnosis not present

## 2024-06-19 DIAGNOSIS — I5032 Chronic diastolic (congestive) heart failure: Secondary | ICD-10-CM | POA: Diagnosis not present

## 2024-06-19 DIAGNOSIS — I442 Atrioventricular block, complete: Secondary | ICD-10-CM | POA: Diagnosis not present

## 2024-06-19 DIAGNOSIS — E890 Postprocedural hypothyroidism: Secondary | ICD-10-CM | POA: Diagnosis not present

## 2024-06-19 DIAGNOSIS — E7800 Pure hypercholesterolemia, unspecified: Secondary | ICD-10-CM | POA: Diagnosis not present

## 2024-06-19 DIAGNOSIS — Z0000 Encounter for general adult medical examination without abnormal findings: Secondary | ICD-10-CM | POA: Diagnosis not present

## 2024-06-19 DIAGNOSIS — K219 Gastro-esophageal reflux disease without esophagitis: Secondary | ICD-10-CM | POA: Diagnosis not present

## 2024-07-10 ENCOUNTER — Other Ambulatory Visit: Payer: Self-pay | Admitting: Physician Assistant

## 2024-07-22 NOTE — Progress Notes (Unsigned)
  Electrophysiology Office Follow up Visit Note:    Date:  07/23/2024   ID:  Lori Rosario, DOB 07/23/1941, MRN [id number]  PCP:  Jeffie Cheryl BRAVO, MD  [facility] Cardiologist:  Lonni Hanson, MD  [facility] Electrophysiologist:  OLE ONEIDA HOLTS, MD    Interval History:     Lori Rosario is a 83 y.o. female who presents for a follow up visit.   The patient last saw Elvie July 05, 2023.  The patient has a history of heart block requiring permanent pacemaker implant.  She is doing well today.  No complaints.      Past medical, surgical, social and family history were reviewed.  ROS:   Please see the history of present illness.    All other systems reviewed and are negative.  EKGs/Labs/Other Studies Reviewed:    The following studies were reviewed today:  July 23, 2024 in-clinic device interrogation personally reviewed Battery and lead parameter stable Shortened AV delays today         Physical Exam:    VS:  BP (!) 108/50   Pulse 88   Ht 5' 2 (1.575 m)   Wt 110 lb (49.9 kg)   SpO2 97%   BMI 20.12 kg/m     Wt Readings from Last 3 Encounters:  07/23/24 110 lb (49.9 kg)  06/09/24 111 lb 8 oz (50.6 kg)  12/12/23 116 lb 9.6 oz (52.9 kg)     GEN: no distress CARD: RRR, No MRG.  Generator pocket well-healed RESP: No IWOB. CTAB.      ASSESSMENT:    1. Cardiac pacemaker in situ   2. Complete heart block (HCC)    PLAN:    In order of problems listed above:  #Complete heart block #Permanent pacemaker in situ Device functioning appropriately.  Continue remote monitoring. Narrow paced QRS, 124 ms.  I discussed my upcoming departure from [facility] during today's clinic appointment.  The patient will continue to follow-up with one of my EP partners moving forward.  Follow-up 1 year with EP APP  Signed, Ole Holts, MD, [facility], [facility] 07/23/2024 11:38 AM    Electrophysiology [facility]

## 2024-07-23 ENCOUNTER — Ambulatory Visit: Attending: Cardiology | Admitting: Cardiology

## 2024-07-23 VITALS — BP 108/50 | HR 88 | Ht 62.0 in | Wt 110.0 lb

## 2024-07-23 DIAGNOSIS — I442 Atrioventricular block, complete: Secondary | ICD-10-CM

## 2024-07-23 DIAGNOSIS — Z950 Presence of cardiac pacemaker: Secondary | ICD-10-CM

## 2024-07-23 LAB — CUP PACEART INCLINIC DEVICE CHECK
Date Time Interrogation Session: 20251126114611
Implantable Lead Connection Status: 753985
Implantable Lead Connection Status: 753985
Implantable Lead Implant Date: 20221010
Implantable Lead Implant Date: 20221010
Implantable Lead Location: 753859
Implantable Lead Location: 753860
Implantable Lead Model: 377171
Implantable Lead Model: 377171
Implantable Lead Serial Number: 8000510718
Implantable Lead Serial Number: 8000555079
Implantable Pulse Generator Implant Date: 20221010
Pulse Gen Model: 407145
Pulse Gen Serial Number: 70231665

## 2024-07-23 NOTE — Patient Instructions (Signed)

## 2024-07-28 ENCOUNTER — Ambulatory Visit: Payer: Self-pay | Admitting: Cardiology

## 2024-08-07 DIAGNOSIS — H2512 Age-related nuclear cataract, left eye: Secondary | ICD-10-CM | POA: Diagnosis not present

## 2024-09-02 ENCOUNTER — Ambulatory Visit: Payer: PPO

## 2024-09-02 DIAGNOSIS — I442 Atrioventricular block, complete: Secondary | ICD-10-CM

## 2024-09-03 LAB — CUP PACEART REMOTE DEVICE CHECK
Date Time Interrogation Session: 20260106101857
Implantable Lead Connection Status: 753985
Implantable Lead Connection Status: 753985
Implantable Lead Implant Date: 20221010
Implantable Lead Implant Date: 20221010
Implantable Lead Location: 753859
Implantable Lead Location: 753860
Implantable Lead Model: 377171
Implantable Lead Model: 377171
Implantable Lead Serial Number: 8000510718
Implantable Lead Serial Number: 8000555079
Implantable Pulse Generator Implant Date: 20221010
Pulse Gen Model: 407145
Pulse Gen Serial Number: 70231665

## 2024-09-05 NOTE — Progress Notes (Signed)
 Remote PPM Transmission

## 2024-09-06 ENCOUNTER — Ambulatory Visit: Payer: Self-pay | Admitting: Cardiology
